# Patient Record
Sex: Female | Born: 1985 | Race: Black or African American | Hispanic: No | Marital: Married | Smoking: Former smoker
Health system: Southern US, Community
[De-identification: ages and names within clinical notes are randomized; demographics above are authoritative.]

## PROBLEM LIST (undated history)

## (undated) ENCOUNTER — Inpatient Hospital Stay (HOSPITAL_COMMUNITY): Payer: Self-pay

## (undated) DIAGNOSIS — A749 Chlamydial infection, unspecified: Secondary | ICD-10-CM

## (undated) DIAGNOSIS — A549 Gonococcal infection, unspecified: Secondary | ICD-10-CM

## (undated) HISTORY — DX: Chlamydial infection, unspecified: A74.9

## (undated) HISTORY — PX: DILATION AND EVACUATION: SHX1459

## (undated) HISTORY — DX: Gonococcal infection, unspecified: A54.9

---
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
## 2004-04-21 ENCOUNTER — Inpatient Hospital Stay (HOSPITAL_COMMUNITY): Admission: AD | Admit: 2004-04-21 | Discharge: 2004-04-21 | Payer: Self-pay | Admitting: Obstetrics and Gynecology

## 2004-04-21 ENCOUNTER — Emergency Department (HOSPITAL_COMMUNITY): Admission: EM | Admit: 2004-04-21 | Discharge: 2004-04-21 | Payer: Self-pay | Admitting: Family Medicine

## 2004-06-08 ENCOUNTER — Inpatient Hospital Stay (HOSPITAL_COMMUNITY): Admission: AD | Admit: 2004-06-08 | Discharge: 2004-06-09 | Payer: Self-pay | Admitting: Obstetrics and Gynecology

## 2004-06-20 ENCOUNTER — Inpatient Hospital Stay (HOSPITAL_COMMUNITY): Admission: AD | Admit: 2004-06-20 | Discharge: 2004-06-22 | Payer: Self-pay | Admitting: Obstetrics and Gynecology

## 2004-08-10 ENCOUNTER — Other Ambulatory Visit: Admission: RE | Admit: 2004-08-10 | Discharge: 2004-08-10 | Payer: Self-pay | Admitting: Obstetrics and Gynecology
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
## 2009-09-10 ENCOUNTER — Emergency Department (HOSPITAL_COMMUNITY): Admission: EM | Admit: 2009-09-10 | Discharge: 2009-09-10 | Payer: Self-pay | Admitting: Emergency Medicine

## 2012-12-03 NOTE — L&D Delivery Note (Signed)
Delivery Note Overnight, she continued to have irregular ctx and pelvic pressure, as well as some spotting.  She was given Indocin to help with ctx, started on Unasyn for temp of 100.5.  She was transferred to L&D and received an epidural.  This am she was feeling more pressure, nurse could not feel any cervix on exam, pt requested AROM to help complete the process.  When I checked her, there was BBOW almost to introitus, I could not palpate any cervix.  The situation was discussed with the pt and she agreed to AROM.  AROM performed, copious clear yellow fluid, vertex immediately at +2 station.  She pushed well within minutes of AROM.  At 6:18 AM a non-viable female was delivered via Vaginal, Spontaneous Delivery (Presentation: vtx). The baby was moving and still had a slow heartbeat 20 minutes after delivery, weight pending.   While trying to assist in delivering the placenta, the cord pulled off of the placenta, cord and some membranes delivered.  The placenta remained in situ, mild-mod bleeding.  Anesthesia: Epidural  Episiotomy: None Lacerations:  None Suture Repair: none Est. Blood Loss (mL): 400  Discussed retained placenta and need to go to the OR to remove it.  The procedure and risks were discussed, consent signed.    [name] D 06/04/2013, 6:51 AM

## 2013-03-03 ENCOUNTER — Encounter (HOSPITAL_COMMUNITY): Payer: Self-pay

## 2013-03-03 ENCOUNTER — Inpatient Hospital Stay (HOSPITAL_COMMUNITY)
Admission: AD | Admit: 2013-03-03 | Discharge: 2013-03-03 | Disposition: A | Discharge status: Home / Self Care | Payer: Medicaid Other | Source: Ambulatory Visit | Attending: Obstetrics & Gynecology | Admitting: Obstetrics & Gynecology

## 2013-03-03 DIAGNOSIS — Z3201 Encounter for pregnancy test, result positive: Secondary | ICD-10-CM

## 2013-03-03 LAB — POCT PREGNANCY, URINE: Preg Test, Ur: POSITIVE — AB

## 2013-03-03 NOTE — MAU Note (Signed)
Pt states LMP roughly 2nd or 3rd week in February. Has morning sickness, was having bleeding last week, however none now, denies pain at present.

## 2013-03-03 NOTE — MAU Provider Note (Signed)
[name] is a 27 y.o. G2P1001 at [redacted]w[redacted]d who presents to MAU today for confirmation of pregnancy. The patient states no vaginal bleeding, abdominal pain or fever today. She has occasional nausea without vomiting. She is able to take in PO liquids and solids today.   BP 96/66  Pulse 73  Temp(Src) 98.4 F (36.9 C) (Oral)  Resp 18  Ht 5\' 4"  (1.626 m)  Wt 156 lb 8 oz (70.988 kg)  BMI 26.85 kg/m2  LMP 01/17/2013 GENERAL: Well-developed, well-nourished female in no acute distress.  HEENT: Normocephalic, atraumatic. LUNGS: Effort normal HEART: Regular rate  SKIN: Warm, dry and without erythema PSYCH: appropriate mood and affect  Results for orders placed during the hospital encounter of 03/03/13 (from the past 24 hour(s))  POCT PREGNANCY, URINE     Status: Abnormal   Collection Time    03/03/13 10:30 AM      Result Value Range   Preg Test, Ur POSITIVE (*) NEGATIVE    A: Positive pregnancy test  P: Discharge home Pregnancy confirmation letter and list of area OB providers given Bleeding/Ectopic precautions discussed Patient may return to MAU as needed or if her condition were to change or worsen  Freddi Starr, PA-C 03/03/2013 10:35 AM

## 2013-03-03 NOTE — MAU Note (Signed)
Pt here for upt only, and verification letter. Has to be at work at 1100 am. No pain. JEthier, PA-C discharged pt from triage room.

## 2013-03-29 ENCOUNTER — Inpatient Hospital Stay (HOSPITAL_COMMUNITY): Payer: Medicaid Other

## 2013-03-29 ENCOUNTER — Encounter (HOSPITAL_COMMUNITY): Payer: Self-pay | Admitting: *Deleted

## 2013-03-29 ENCOUNTER — Inpatient Hospital Stay (HOSPITAL_COMMUNITY)
Admission: AD | Admit: 2013-03-29 | Discharge: 2013-03-29 | Disposition: A | Discharge status: Home / Self Care | Payer: Medicaid Other | Source: Ambulatory Visit | Attending: Obstetrics & Gynecology | Admitting: Obstetrics & Gynecology

## 2013-03-29 DIAGNOSIS — O99891 Other specified diseases and conditions complicating pregnancy: Secondary | ICD-10-CM | POA: Insufficient documentation

## 2013-03-29 DIAGNOSIS — R109 Unspecified abdominal pain: Secondary | ICD-10-CM | POA: Insufficient documentation

## 2013-03-29 DIAGNOSIS — N949 Unspecified condition associated with female genital organs and menstrual cycle: Secondary | ICD-10-CM

## 2013-03-29 LAB — URINALYSIS, ROUTINE W REFLEX MICROSCOPIC
Glucose, UA: NEGATIVE mg/dL
Ketones, ur: 15 mg/dL — AB
Nitrite: NEGATIVE
Specific Gravity, Urine: >1.03 — ABNORMAL HIGH (ref 1.005–1.030)
pH: 5.5 (ref 5.0–8.0)

## 2013-03-29 LAB — URINE MICROSCOPIC-ADD ON

## 2013-03-29 NOTE — MAU Note (Signed)
Pt presents with complaints of abdominal pain that started while she was in church today.Denies any bleeding

## 2013-03-29 NOTE — MAU Provider Note (Signed)
History     CSN: [id number]  Arrival date and time: 03/29/13 1146   None     Chief Complaint  Patient presents with  . Abdominal Pain   HPI 27 y.o. G2P1001 at [redacted]w[redacted]d with intermittent low abd pain starting today about 1.5 hours ago. No bleeding or discharge.    Past Medical History  Diagnosis Date  . Medical history non-contributory     Past Surgical History  Procedure Laterality Date  . No past surgeries      History reviewed. No pertinent family history.  History  Substance Use Topics  . Smoking status: Former Smoker -- 0.10 packs/day    Types: Cigarettes  . Smokeless tobacco: Former Neurosurgeon    Quit date: 03/04/2013  . Alcohol Use: Yes     Comment: not since pregnancy confirmed    Allergies: No Known Allergies  No prescriptions prior to admission    Review of Systems  Constitutional: Negative.   Respiratory: Negative.   Cardiovascular: Negative.   Gastrointestinal: Positive for abdominal pain. Negative for nausea, vomiting, diarrhea and constipation.  Genitourinary: Negative for dysuria, urgency, frequency, hematuria and flank pain.       Negative for vaginal bleeding, vaginal discharge, dyspareunia  Musculoskeletal: Negative.   Neurological: Negative.   Psychiatric/Behavioral: Negative.    Physical Exam   Blood pressure 117/62, pulse 74, temperature 98 F (36.7 C), temperature source Oral, resp. rate 18, last menstrual period 01/17/2013.  Physical Exam  Nursing note and vitals reviewed. Constitutional: She is oriented to person, place, and time. She appears well-developed and well-nourished. No distress.  Cardiovascular: Normal rate.   Respiratory: Effort normal.  GI: Soft. There is no tenderness.  Musculoskeletal: Normal range of motion.  Neurological: She is alert and oriented to person, place, and time.  Skin: Skin is warm.  Psychiatric: She has a normal mood and affect.   Unable to doppler FHR  MAU Course  Procedures Results for orders  placed during the hospital encounter of 03/29/13 (from the past 24 hour(s))  URINALYSIS, ROUTINE W REFLEX MICROSCOPIC     Status: Abnormal   Collection Time    03/29/13 12:15 PM      Result Value Range   Color, Urine YELLOW  YELLOW   APPearance CLEAR  CLEAR   Specific Gravity, Urine >1.030 (*) 1.005 - 1.030   pH 5.5  5.0 - 8.0   Glucose, UA NEGATIVE  NEGATIVE mg/dL   Hgb urine dipstick NEGATIVE  NEGATIVE   Bilirubin Urine NEGATIVE  NEGATIVE   Ketones, ur 15 (*) NEGATIVE mg/dL   Protein, ur NEGATIVE  NEGATIVE mg/dL   Urobilinogen, UA 1.0  0.0 - 1.0 mg/dL   Nitrite NEGATIVE  NEGATIVE   Leukocytes, UA TRACE (*) NEGATIVE  URINE MICROSCOPIC-ADD ON     Status: Abnormal   Collection Time    03/29/13 12:15 PM      Result Value Range   Squamous Epithelial / LPF MANY (*) RARE   WBC, UA 3-6  <3 WBC/hpf   Urine-Other MUCOUS PRESENT      US Ob Comp Less 14 Wks  03/29/2013  *RADIOLOGY REPORT*  Clinical Data: 27 year old pregnant female with pelvic pain. Estimated gestational age of [redacted] weeks 1 day by LMP.  OBSTETRIC <14 WK ULTRASOUND  Technique:  Transabdominal ultrasound was performed for evaluation of the gestation as well as the maternal uterus and adnexal regions.  Comparison:  None  Intrauterine gestational sac: Visualized/normal in shape. Yolk sac: Present Embryo: Present Cardiac Activity: Present  Heart Rate: 156 bpm  CRL:  54 mm  12 w  1 d        [place] EDC: 10/10/2013  Maternal uterus/Adnexae: There is no evidence of subchorionic hemorrhage. The ovaries bilaterally are unremarkable There is no evidence of free fluid or solid adnexal mass.  IMPRESSION: Single living intrauterine gestation with estimated gestational age of [redacted] weeks 1 day by this ultrasound.  No evidence of subchorionic hemorrhage.   Original Report Authenticated By: Harmon Pier, M.D.    Assessment and Plan  27 y.o. Z6X0960 at [redacted]w[redacted]d with viable IUP on u/s F/U for prenatal care as soon as possible, list of providers given, pregnancy  verification given, precautions rev'd  [name] 03/29/2013, 12:40 PM

## 2013-04-02 ENCOUNTER — Inpatient Hospital Stay (HOSPITAL_COMMUNITY)
Admission: AD | Admit: 2013-04-02 | Discharge: 2013-04-02 | Disposition: A | Discharge status: Home / Self Care | Payer: Medicaid Other | Source: Ambulatory Visit | Attending: Obstetrics and Gynecology | Admitting: Obstetrics and Gynecology

## 2013-04-02 ENCOUNTER — Encounter (HOSPITAL_COMMUNITY): Payer: Self-pay | Admitting: *Deleted

## 2013-04-02 DIAGNOSIS — O209 Hemorrhage in early pregnancy, unspecified: Secondary | ICD-10-CM

## 2013-04-02 LAB — ABO/RH: ABO/RH(D): A POS

## 2013-04-02 LAB — URINALYSIS, ROUTINE W REFLEX MICROSCOPIC
Bilirubin Urine: NEGATIVE
Glucose, UA: NEGATIVE mg/dL
Ketones, ur: NEGATIVE mg/dL
Leukocytes, UA: NEGATIVE
Nitrite: NEGATIVE
Specific Gravity, Urine: 1.015 (ref 1.005–1.030)
pH: 7.5 (ref 5.0–8.0)

## 2013-04-02 LAB — CBC
HCT: 36 % (ref 36.0–46.0)
Hemoglobin: 12.3 g/dL (ref 12.0–15.0)
MCH: 32.7 pg (ref 26.0–34.0)
RBC: 3.76 MIL/uL — ABNORMAL LOW (ref 3.87–5.11)

## 2013-04-02 LAB — URINE MICROSCOPIC-ADD ON

## 2013-04-02 LAB — WET PREP, GENITAL: Trich, Wet Prep: NONE SEEN

## 2013-04-02 MED ORDER — PRENATAL PLUS 27-1 MG PO TABS: 1.0000 | ORAL_TABLET | Freq: Every day | ORAL | Status: DC

## 2013-04-02 NOTE — MAU Note (Signed)
Had ligjht bleeding about a month ago.  Was here on Sunday, had US-everything was ok.  Woke up this morning noted blood passed a few small clots. None currently.

## 2013-04-02 NOTE — MAU Note (Signed)
Patient states she woke up this am with bleeding on tissue with wiping. Denies any pain.

## 2013-04-02 NOTE — MAU Provider Note (Signed)
Chief Complaint: Vaginal Bleeding  First Provider Initiated Contact with Patient 04/02/13 0800     SUBJECTIVE HPI: [name] is a 27 y.o. Z6X0960 at [redacted]w[redacted]d by LMP who presents with bleeding with wiping this morning. Denies any pain or passage of tissue.   Past Medical History  Diagnosis Date  . Medical history non-contributory    OB History   Grav Para Term Preterm Abortions TAB SAB Ect Mult Living   8 1 1  6 6    1      # Outc Date GA Lbr Len/2nd Wgt Sex Del Anes PTL Lv   1 TRM            2 TAB            3 TAB            4 TAB            5 TAB            6 TAB            7 TAB            8 CUR              Past Surgical History  Procedure Laterality Date  . No past surgeries    . Dilation and evacuation      Pt reports that she has had 6 surgical elective ABs - pt disclosed this to sonographer and stated that she did not want anyone else to know   History   Social History  . Marital Status: Single    Spouse Name: N/A    Number of Children: N/A  . Years of Education: N/A   Occupational History  . Not on file.   Social History Main Topics  . Smoking status: Former Smoker -- 0.10 packs/day    Types: Cigarettes  . Smokeless tobacco: Former Neurosurgeon    Quit date: 03/04/2013  . Alcohol Use: Yes     Comment: not since pregnancy confirmed  . Drug Use: No  . Sexually Active: Yes    Birth Control/ Protection: None   Other Topics Concern  . Not on file   Social History Narrative  . No narrative on file   No current facility-administered medications on file prior to encounter.   No current outpatient prescriptions on file prior to encounter.   No Known Allergies  ROS: Pertinent items in HPI  CBC    Component Value Date/Time   WBC 5.8 04/02/2013 0735   RBC 3.76* 04/02/2013 0735   HGB 12.3 04/02/2013 0735   HCT 36.0 04/02/2013 0735   PLT 213 04/02/2013 0735   MCV 95.7 04/02/2013 0735   MCH 32.7 04/02/2013 0735   MCHC 34.2 04/02/2013 0735   RDW 12.2 04/02/2013 0735    --/--/A POS (05/01 0735)  OBJECTIVE Blood pressure 114/68, pulse 76, temperature 98.2 F (36.8 C), temperature source Oral, resp. rate 16, height 5\' 3"  (1.6 m), weight 73.664 kg (162 lb 6.4 oz), last menstrual period 01/17/2013, SpO2 100.00%. GENERAL: Well-developed, well-nourished female in no acute distress.  HEENT: Normocephalic HEART: normal rate RESP: normal effort ABDOMEN: Soft, non-tender EXTREMITIES: Nontender, no edema NEURO: Alert and oriented SPECULUM EXAM: NEFG, scant amount of pink blood noted, cervix clean. BIMANUAL: cervix closed; uterus 12 week size, no adnexal tenderness or masses FHR 158 by doppler  LAB RESULTS   IMAGING US Ob Comp Less 14 Wks  03/29/2013  *RADIOLOGY REPORT*  Clinical Data:  27 year old pregnant female with pelvic pain. Estimated gestational age of [redacted] weeks 1 day by LMP.  OBSTETRIC <14 WK ULTRASOUND  Technique:  Transabdominal ultrasound was performed for evaluation of the gestation as well as the maternal uterus and adnexal regions.  Comparison:  None  Intrauterine gestational sac: Visualized/normal in shape. Yolk sac: Present Embryo: Present Cardiac Activity: Present Heart Rate: 156 bpm  CRL:  54 mm  12 w  1 d        [place] EDC: 10/10/2013  Maternal uterus/Adnexae: There is no evidence of subchorionic hemorrhage. The ovaries bilaterally are unremarkable There is no evidence of free fluid or solid adnexal mass.  IMPRESSION: Single living intrauterine gestation with estimated gestational age of [redacted] weeks 1 day by this ultrasound.  No evidence of subchorionic hemorrhage.   Original Report Authenticated By: Harmon Pier, M.D.     MAU COURSE Care of pt turned over to Caren Griffins, CNM at 0800.  [place], CNM 04/02/2013 8:13 AM   ASSESSMENT 1. Bleeding in early pregnancy   Viable 12 wk IUP, no [facility]   PLAN Discharge home    Medication List    TAKE these medications       prenatal vitamin w/FE, FA 27-1 MG Tabs  Take 1 tablet by mouth daily.        Follow-up Information   Follow up with [facility] [place]. Schedule an appointment as soon as possible for a visit in 1 week. (See lilst of prenatal care providers below)    Contact information:   [address] [place] [address] [phone]

## 2013-04-03 LAB — GC/CHLAMYDIA PROBE AMP: CT Probe RNA: POSITIVE — AB

## 2013-04-03 NOTE — MAU Provider Note (Signed)
Attestation of Attending Supervision of Advanced Practitioner (CNM/NP): Evaluation and management procedures were performed by the Advanced Practitioner under my supervision and collaboration.  I have reviewed the Advanced Practitioner's note and chart, and I agree with the management and plan.  [name] 04/03/2013 7:34 AM

## 2013-04-04 ENCOUNTER — Encounter: Payer: Self-pay | Admitting: Advanced Practice Midwife

## 2013-04-04 DIAGNOSIS — O98211 Gonorrhea complicating pregnancy, first trimester: Secondary | ICD-10-CM | POA: Insufficient documentation

## 2013-04-04 DIAGNOSIS — A749 Chlamydial infection, unspecified: Secondary | ICD-10-CM | POA: Insufficient documentation

## 2013-04-04 DIAGNOSIS — O98819 Other maternal infectious and parasitic diseases complicating pregnancy, unspecified trimester: Secondary | ICD-10-CM

## 2013-04-30 LAB — OB RESULTS CONSOLE HIV ANTIBODY (ROUTINE TESTING): HIV: NONREACTIVE

## 2013-04-30 LAB — OB RESULTS CONSOLE GC/CHLAMYDIA
Chlamydia: NEGATIVE
Gonorrhea: NEGATIVE

## 2013-04-30 LAB — OB RESULTS CONSOLE RUBELLA ANTIBODY, IGM: Rubella: IMMUNE

## 2013-04-30 LAB — OB RESULTS CONSOLE ANTIBODY SCREEN: Antibody Screen: NEGATIVE

## 2013-04-30 LAB — OB RESULTS CONSOLE RPR: RPR: NONREACTIVE

## 2013-04-30 LAB — OB RESULTS CONSOLE HEPATITIS B SURFACE ANTIGEN: Hepatitis B Surface Ag: NEGATIVE

## 2013-06-03 ENCOUNTER — Inpatient Hospital Stay (HOSPITAL_COMMUNITY)
Admission: AD | Admit: 2013-06-03 | Discharge: 2013-06-05 | DRG: 767 | Disposition: A | Discharge status: Home / Self Care | Payer: Medicaid Other | Source: Ambulatory Visit | Attending: Obstetrics and Gynecology | Admitting: Obstetrics and Gynecology

## 2013-06-03 ENCOUNTER — Encounter (HOSPITAL_COMMUNITY): Payer: Self-pay | Admitting: *Deleted

## 2013-06-03 ENCOUNTER — Inpatient Hospital Stay (HOSPITAL_COMMUNITY): Payer: Medicaid Other

## 2013-06-03 DIAGNOSIS — O4700 False labor before 37 completed weeks of gestation, unspecified trimester: Secondary | ICD-10-CM | POA: Diagnosis present

## 2013-06-03 DIAGNOSIS — A749 Chlamydial infection, unspecified: Secondary | ICD-10-CM

## 2013-06-03 DIAGNOSIS — O321XX0 Maternal care for breech presentation, not applicable or unspecified: Secondary | ICD-10-CM | POA: Diagnosis present

## 2013-06-03 DIAGNOSIS — O4290 Premature rupture of membranes, unspecified as to length of time between rupture and onset of labor, unspecified weeks of gestation: Principal | ICD-10-CM | POA: Diagnosis present

## 2013-06-03 DIAGNOSIS — O4702 False labor before 37 completed weeks of gestation, second trimester: Secondary | ICD-10-CM

## 2013-06-03 DIAGNOSIS — O98211 Gonorrhea complicating pregnancy, first trimester: Secondary | ICD-10-CM

## 2013-06-03 LAB — CBC
HCT: 31.8 % — ABNORMAL LOW (ref 36.0–46.0)
MCH: 32.2 pg (ref 26.0–34.0)
MCHC: 34.3 g/dL (ref 30.0–36.0)
RDW: 12.6 % (ref 11.5–15.5)

## 2013-06-03 MED ORDER — SODIUM CHLORIDE 0.9 % IV SOLN
2.0000 g | Freq: Four times a day (QID) | INTRAVENOUS | Status: DC
  Administered 2013-06-03: 2 g via INTRAVENOUS
  Filled 2013-06-03: qty 2000

## 2013-06-03 MED ORDER — NIFEDIPINE 10 MG PO CAPS
20.0000 mg | ORAL_CAPSULE | ORAL | Status: DC | PRN
  Administered 2013-06-03 (×2): 20 mg via ORAL
  Filled 2013-06-03 (×3): qty 2

## 2013-06-03 MED ORDER — INDOMETHACIN 50 MG RE SUPP
50.0000 mg | Freq: Three times a day (TID) | RECTAL | Status: DC
  Administered 2013-06-03: 50 mg via RECTAL
  Filled 2013-06-03 (×3): qty 1

## 2013-06-03 MED ORDER — IBUPROFEN 600 MG PO TABS: 600.0000 mg | ORAL_TABLET | Freq: Four times a day (QID) | ORAL | Status: DC | PRN

## 2013-06-03 MED ORDER — OXYCODONE-ACETAMINOPHEN 5-325 MG PO TABS: 1.0000 | ORAL_TABLET | ORAL | Status: DC | PRN

## 2013-06-03 MED ORDER — ACETAMINOPHEN 325 MG PO TABS
650.0000 mg | ORAL_TABLET | ORAL | Status: DC | PRN
  Administered 2013-06-03: 650 mg via ORAL
  Filled 2013-06-03: qty 2

## 2013-06-03 MED ORDER — DOCUSATE SODIUM 100 MG PO CAPS: 100.0000 mg | ORAL_CAPSULE | Freq: Every day | ORAL | Status: DC

## 2013-06-03 MED ORDER — PRENATAL MULTIVITAMIN CH: 1.0000 | ORAL_TABLET | Freq: Every day | ORAL | Status: DC

## 2013-06-03 MED ORDER — OXYTOCIN 40 UNITS IN LACTATED RINGERS INFUSION - SIMPLE MED
62.5000 mL/h | INTRAVENOUS | Status: DC
  Filled 2013-06-03: qty 1000

## 2013-06-03 MED ORDER — LACTATED RINGERS IV SOLN
INTRAVENOUS | Status: DC
  Administered 2013-06-03 – 2013-06-04 (×4): via INTRAVENOUS

## 2013-06-03 MED ORDER — CALCIUM CARBONATE ANTACID 500 MG PO CHEW: 2.0000 | CHEWABLE_TABLET | ORAL | Status: DC | PRN

## 2013-06-03 MED ORDER — AMOXICILLIN 500 MG PO CAPS: 500.0000 mg | ORAL_CAPSULE | Freq: Three times a day (TID) | ORAL | Status: DC

## 2013-06-03 MED ORDER — ONDANSETRON HCL 4 MG/2ML IJ SOLN: 4.0000 mg | Freq: Four times a day (QID) | INTRAMUSCULAR | Status: DC | PRN

## 2013-06-03 MED ORDER — AZITHROMYCIN 500 MG PO TABS
500.0000 mg | ORAL_TABLET | Freq: Every day | ORAL | Status: DC
  Administered 2013-06-03: 500 mg via ORAL
  Filled 2013-06-03: qty 1

## 2013-06-03 MED ORDER — ZOLPIDEM TARTRATE 5 MG PO TABS: 5.0000 mg | ORAL_TABLET | Freq: Every evening | ORAL | Status: DC | PRN

## 2013-06-03 MED ORDER — CITRIC ACID-SODIUM CITRATE 334-500 MG/5ML PO SOLN
30.0000 mL | ORAL | Status: DC | PRN
  Administered 2013-06-04: 30 mL via ORAL
  Filled 2013-06-03: qty 15

## 2013-06-03 MED ORDER — BUTORPHANOL TARTRATE 1 MG/ML IJ SOLN
1.0000 mg | INTRAMUSCULAR | Status: DC | PRN
  Administered 2013-06-03 – 2013-06-04 (×3): 1 mg via INTRAVENOUS
  Filled 2013-06-03 (×3): qty 1

## 2013-06-03 MED ORDER — FLEET ENEMA 7-19 GM/118ML RE ENEM: 1.0000 | ENEMA | Freq: Every day | RECTAL | Status: DC | PRN

## 2013-06-03 MED ORDER — LIDOCAINE HCL (PF) 1 % IJ SOLN
30.0000 mL | INTRAMUSCULAR | Status: DC | PRN
  Filled 2013-06-03: qty 30

## 2013-06-03 MED ORDER — OXYTOCIN BOLUS FROM INFUSION: 500.0000 mL | INTRAVENOUS | Status: DC

## 2013-06-03 MED ORDER — LACTATED RINGERS IV SOLN
500.0000 mL | INTRAVENOUS | Status: DC | PRN
  Administered 2013-06-04: 300 mL via INTRAVENOUS

## 2013-06-03 MED ORDER — AMPICILLIN-SULBACTAM SODIUM 3 (2-1) G IJ SOLR
3.0000 g | Freq: Four times a day (QID) | INTRAMUSCULAR | Status: DC
  Administered 2013-06-03 – 2013-06-04 (×2): 3 g via INTRAVENOUS
  Filled 2013-06-03 (×6): qty 3

## 2013-06-03 MED ORDER — INDOMETHACIN 25 MG SUPPOSITORY: 25.0000 mg | Freq: Three times a day (TID) | RECTAL | Status: DC

## 2013-06-03 NOTE — Progress Notes (Signed)
Encouraged side-lying, voiding , and increased po fluids

## 2013-06-03 NOTE — H&P (Signed)
[name] is a 27 y.o. female, G8 P28, EGA 21W 4D, presenting for possible PPROM.  Pt seen i the office today with c/o leaking fluid followed by some cramping.  In the office, fundus appropriate, FHT 150.  On spec exam, BBOW, + nitrazine, neg fern.  Situation discussed with pt and she was admitted to antenatal.  Maternal Medical History:  Reason for admission: Contractions.   Contractions: Frequency: irregular.   Perceived severity is moderate.    Fetal activity: Perceived fetal activity is normal.      OB History   Grav Para Term Preterm Abortions TAB SAB Ect Mult Living   8 1 1  6 6    1     SVD at term Past Medical History  Diagnosis Date  . Chlamydia   . Gonorrhea    Past Surgical History  Procedure Laterality Date  . Dilation and evacuation      Pt reports that she has had 6 surgical elective ABs - pt disclosed this to sonographer and stated that she did not want anyone else to know   Family History: family history includes Cancer in her father and maternal grandfather and Heart disease in her father. Social History:  reports that she has quit smoking. Her smoking use included Cigarettes. She smoked 0.10 packs per day. She quit smokeless tobacco use about 2 months ago. She reports that  drinks alcohol. She reports that she does not use illicit drugs.   Review of Systems  Respiratory: Negative.   Cardiovascular: Negative.     Dilation: 5.5 Exam by:: Dr Mesiger (BBOW) Blood pressure 107/53, pulse 85, temperature 98.6 F (37 C), temperature source Oral, resp. rate 18, height 5\' 3"  (1.6 m), weight 79.379 kg (175 lb), last menstrual period 01/17/2013. Maternal Exam:  Uterine Assessment: Contraction strength is mild.  Contraction frequency is irregular.   Abdomen: Patient reports no abdominal tenderness. Introitus: Normal vulva. Normal vagina.  Ferning test: negative.  Nitrazine test: positive.  Pelvis: adequate for delivery.   Cervix: Cervix evaluated by  sterile speculum exam and digital exam.     Fetal Exam Fetal Monitor Review: Mode: ultrasound.   Baseline rate: 150.      Physical Exam  Constitutional: She appears well-developed and well-nourished.  Cardiovascular: Normal rate, regular rhythm and normal heart sounds.   No murmur heard. Respiratory: Effort normal and breath sounds normal. No respiratory distress. She has no wheezes.  GI: Soft.  Gravid, fundus at umbilicus    Prenatal labs: ABO, Rh: --/--/A POS (05/01 2130) Antibody: Negative (05/29 0000) Rubella: Immune (05/29 0000) RPR: Nonreactive (05/29 0000)  HBsAg: Negative (05/29 0000)  HIV: Non-reactive (05/29 0000)  GBS:     Assessment/Plan: IUP at 21W 4D with BBOW, irreg ctx, cervix 5-6 cm by exam.  Amnisure neg, nl AFV on u/s so unable to confirm ROM, but definitely with preterm dilation.  Breech by u/s, confirms EGA.  Discussed situation with pt, baby is not currently viable.  Discussed high risk of ROM with delivery.  Will admit, bedrest with BRP, Procardia prn ctx, monitor for ROM or further dilation.  If makes it to 23 weeks would get NICU consult and give steroids.   [name] 06/03/2013, 5:19 PM

## 2013-06-04 ENCOUNTER — Encounter (HOSPITAL_COMMUNITY)
Admission: AD | Disposition: A | Discharge status: Home / Self Care | Payer: Self-pay | Source: Ambulatory Visit | Attending: Obstetrics and Gynecology

## 2013-06-04 ENCOUNTER — Encounter (HOSPITAL_COMMUNITY): Payer: Self-pay | Admitting: Anesthesiology

## 2013-06-04 ENCOUNTER — Inpatient Hospital Stay (HOSPITAL_COMMUNITY): Payer: Medicaid Other | Admitting: Anesthesiology

## 2013-06-04 HISTORY — PX: DILATION AND EVACUATION: SHX1459

## 2013-06-04 LAB — CBC
Hemoglobin: 10.5 g/dL — ABNORMAL LOW (ref 12.0–15.0)
MCH: 31.8 pg (ref 26.0–34.0)
MCHC: 33.9 g/dL (ref 30.0–36.0)
MCV: 93.9 fL (ref 78.0–100.0)
RBC: 3.3 MIL/uL — ABNORMAL LOW (ref 3.87–5.11)

## 2013-06-04 SURGERY — DILATION AND EVACUATION, UTERUS
Anesthesia: Epidural

## 2013-06-04 MED ORDER — OXYCODONE-ACETAMINOPHEN 5-325 MG PO TABS
1.0000 | ORAL_TABLET | ORAL | Status: DC | PRN
  Administered 2013-06-04 (×2): 2 via ORAL
  Administered 2013-06-04: 1 via ORAL
  Administered 2013-06-05 (×2): 2 via ORAL
  Filled 2013-06-04 (×2): qty 2
  Filled 2013-06-04: qty 1
  Filled 2013-06-04 (×2): qty 2

## 2013-06-04 MED ORDER — EPHEDRINE 5 MG/ML INJ: 10.0000 mg | INTRAVENOUS | Status: DC | PRN

## 2013-06-04 MED ORDER — MEASLES, MUMPS & RUBELLA VAC ~~LOC~~ INJ
0.5000 mL | INJECTION | Freq: Once | SUBCUTANEOUS | Status: DC
  Filled 2013-06-04: qty 0.5

## 2013-06-04 MED ORDER — SENNOSIDES-DOCUSATE SODIUM 8.6-50 MG PO TABS
2.0000 | ORAL_TABLET | Freq: Every day | ORAL | Status: DC
  Administered 2013-06-04: 2 via ORAL

## 2013-06-04 MED ORDER — DIBUCAINE 1 % RE OINT: 1.0000 "application " | TOPICAL_OINTMENT | RECTAL | Status: DC | PRN

## 2013-06-04 MED ORDER — PHENYLEPHRINE HCL 10 MG/ML IJ SOLN
INTRAMUSCULAR | Status: DC | PRN
  Administered 2013-06-04 (×3): 40 ug via INTRAVENOUS
  Administered 2013-06-04: 80 ug via INTRAVENOUS
  Administered 2013-06-04 (×2): 40 ug via INTRAVENOUS

## 2013-06-04 MED ORDER — BENZOCAINE-MENTHOL 20-0.5 % EX AERO: 1.0000 "application " | INHALATION_SPRAY | CUTANEOUS | Status: DC | PRN

## 2013-06-04 MED ORDER — MEPERIDINE HCL 25 MG/ML IJ SOLN
INTRAMUSCULAR | Status: AC
  Administered 2013-06-04: 12.5 mg via INTRAVENOUS
  Filled 2013-06-04: qty 1

## 2013-06-04 MED ORDER — TETANUS-DIPHTH-ACELL PERTUSSIS 5-2.5-18.5 LF-MCG/0.5 IM SUSP: 0.5000 mL | Freq: Once | INTRAMUSCULAR | Status: DC

## 2013-06-04 MED ORDER — FENTANYL 2.5 MCG/ML BUPIVACAINE 1/10 % EPIDURAL INFUSION (WH - ANES)
INTRAMUSCULAR | Status: DC | PRN
  Administered 2013-06-04: 14 mL/h via EPIDURAL

## 2013-06-04 MED ORDER — DIPHENHYDRAMINE HCL 50 MG/ML IJ SOLN
12.5000 mg | INTRAMUSCULAR | Status: DC | PRN
  Administered 2013-06-04: 12.5 mg via INTRAVENOUS
  Filled 2013-06-04: qty 1

## 2013-06-04 MED ORDER — OXYTOCIN 10 UNIT/ML IJ SOLN
INTRAMUSCULAR | Status: AC
  Filled 2013-06-04: qty 4

## 2013-06-04 MED ORDER — ONDANSETRON HCL 4 MG PO TABS: 4.0000 mg | ORAL_TABLET | ORAL | Status: DC | PRN

## 2013-06-04 MED ORDER — ZOLPIDEM TARTRATE 5 MG PO TABS: 5.0000 mg | ORAL_TABLET | Freq: Every evening | ORAL | Status: DC | PRN

## 2013-06-04 MED ORDER — MEPERIDINE HCL 25 MG/ML IJ SOLN: 6.2500 mg | INTRAMUSCULAR | Status: DC | PRN

## 2013-06-04 MED ORDER — PHENYLEPHRINE 40 MCG/ML (10ML) SYRINGE FOR IV PUSH (FOR BLOOD PRESSURE SUPPORT)
PREFILLED_SYRINGE | INTRAVENOUS | Status: AC
  Filled 2013-06-04: qty 5

## 2013-06-04 MED ORDER — PRENATAL MULTIVITAMIN CH: 1.0000 | ORAL_TABLET | Freq: Every day | ORAL | Status: DC

## 2013-06-04 MED ORDER — SODIUM BICARBONATE 8.4 % IV SOLN
INTRAVENOUS | Status: DC | PRN
  Administered 2013-06-04: 5 mL via EPIDURAL

## 2013-06-04 MED ORDER — EPHEDRINE 5 MG/ML INJ
10.0000 mg | INTRAVENOUS | Status: DC | PRN
  Filled 2013-06-04: qty 4

## 2013-06-04 MED ORDER — METHYLERGONOVINE MALEATE 0.2 MG/ML IJ SOLN: 0.2000 mg | INTRAMUSCULAR | Status: DC | PRN

## 2013-06-04 MED ORDER — PHENYLEPHRINE 40 MCG/ML (10ML) SYRINGE FOR IV PUSH (FOR BLOOD PRESSURE SUPPORT): 80.0000 ug | PREFILLED_SYRINGE | INTRAVENOUS | Status: DC | PRN

## 2013-06-04 MED ORDER — LIDOCAINE HCL 2 % IJ SOLN
INTRAMUSCULAR | Status: AC
  Filled 2013-06-04: qty 20

## 2013-06-04 MED ORDER — SIMETHICONE 80 MG PO CHEW: 80.0000 mg | CHEWABLE_TABLET | ORAL | Status: DC | PRN

## 2013-06-04 MED ORDER — MAGNESIUM HYDROXIDE 400 MG/5ML PO SUSP: 30.0000 mL | ORAL | Status: DC | PRN

## 2013-06-04 MED ORDER — WITCH HAZEL-GLYCERIN EX PADS: 1.0000 "application " | MEDICATED_PAD | CUTANEOUS | Status: DC | PRN

## 2013-06-04 MED ORDER — LIDOCAINE HCL (PF) 1 % IJ SOLN
INTRAMUSCULAR | Status: DC | PRN
  Administered 2013-06-04 (×2): 9 mL

## 2013-06-04 MED ORDER — MIDAZOLAM HCL 2 MG/2ML IJ SOLN
INTRAMUSCULAR | Status: AC
  Filled 2013-06-04: qty 2

## 2013-06-04 MED ORDER — FENTANYL CITRATE 0.05 MG/ML IJ SOLN: 25.0000 ug | INTRAMUSCULAR | Status: DC | PRN

## 2013-06-04 MED ORDER — ONDANSETRON HCL 4 MG/2ML IJ SOLN: 4.0000 mg | INTRAMUSCULAR | Status: DC | PRN

## 2013-06-04 MED ORDER — MIDAZOLAM HCL 5 MG/5ML IJ SOLN
INTRAMUSCULAR | Status: DC | PRN
  Administered 2013-06-04: 2 mg via INTRAVENOUS

## 2013-06-04 MED ORDER — LACTATED RINGERS IV SOLN
500.0000 mL | Freq: Once | INTRAVENOUS | Status: AC
  Administered 2013-06-04: 500 mL via INTRAVENOUS

## 2013-06-04 MED ORDER — FENTANYL 2.5 MCG/ML BUPIVACAINE 1/10 % EPIDURAL INFUSION (WH - ANES)
14.0000 mL/h | INTRAMUSCULAR | Status: DC | PRN
  Filled 2013-06-04: qty 125

## 2013-06-04 MED ORDER — PHENYLEPHRINE 40 MCG/ML (10ML) SYRINGE FOR IV PUSH (FOR BLOOD PRESSURE SUPPORT)
80.0000 ug | PREFILLED_SYRINGE | INTRAVENOUS | Status: DC | PRN
  Filled 2013-06-04: qty 5

## 2013-06-04 MED ORDER — DIPHENHYDRAMINE HCL 25 MG PO CAPS
25.0000 mg | ORAL_CAPSULE | Freq: Four times a day (QID) | ORAL | Status: DC | PRN
  Administered 2013-06-04 (×2): 25 mg via ORAL
  Filled 2013-06-04 (×2): qty 1

## 2013-06-04 MED ORDER — IBUPROFEN 600 MG PO TABS
600.0000 mg | ORAL_TABLET | Freq: Four times a day (QID) | ORAL | Status: DC
  Administered 2013-06-04 – 2013-06-05 (×4): 600 mg via ORAL
  Filled 2013-06-04 (×4): qty 1

## 2013-06-04 MED ORDER — LANOLIN HYDROUS EX OINT: TOPICAL_OINTMENT | CUTANEOUS | Status: DC | PRN

## 2013-06-04 MED ORDER — ONDANSETRON HCL 4 MG/2ML IJ SOLN: 4.0000 mg | Freq: Once | INTRAMUSCULAR | Status: DC | PRN

## 2013-06-04 MED ORDER — OXYTOCIN 10 UNIT/ML IJ SOLN
40.0000 [IU] | INTRAVENOUS | Status: DC | PRN
  Administered 2013-06-04: 40 [IU] via INTRAVENOUS

## 2013-06-04 MED ORDER — ACETAMINOPHEN 10 MG/ML IV SOLN: 1000.0000 mg | Freq: Once | INTRAVENOUS | Status: DC | PRN

## 2013-06-04 MED ORDER — METHYLERGONOVINE MALEATE 0.2 MG PO TABS: 0.2000 mg | ORAL_TABLET | ORAL | Status: DC | PRN

## 2013-06-04 SURGICAL SUPPLY — 20 items
CATH ROBINSON RED A/P 16FR (CATHETERS) ×2 IMPLANT
CLOTH BEACON ORANGE TIMEOUT ST (SAFETY) ×2 IMPLANT
DECANTER SPIKE VIAL GLASS SM (MISCELLANEOUS) ×2 IMPLANT
GLOVE BIO SURGEON STRL SZ8 (GLOVE) ×2 IMPLANT
GLOVE ORTHO TXT STRL SZ7.5 (GLOVE) ×2 IMPLANT
GOWN STRL REIN XL XLG (GOWN DISPOSABLE) ×4 IMPLANT
KIT BERKELEY 1ST TRIMESTER 3/8 (MISCELLANEOUS) ×2 IMPLANT
NDL SPNL 22GX3.5 QUINCKE BK (NEEDLE) ×1 IMPLANT
NEEDLE SPNL 22GX3.5 QUINCKE BK (NEEDLE) ×2 IMPLANT
NS IRRIG 1000ML POUR BTL (IV SOLUTION) ×2 IMPLANT
PACK VAGINAL MINOR WOMEN LF (CUSTOM PROCEDURE TRAY) ×2 IMPLANT
PAD OB MATERNITY 4.3X12.25 (PERSONAL CARE ITEMS) ×2 IMPLANT
PAD PREP 24X48 CUFFED NSTRL (MISCELLANEOUS) ×2 IMPLANT
SET BERKELEY SUCTION TUBING (SUCTIONS) ×2 IMPLANT
SYR CONTROL 10ML LL (SYRINGE) ×2 IMPLANT
TOWEL OR 17X24 6PK STRL BLUE (TOWEL DISPOSABLE) ×4 IMPLANT
VACURETTE 10 RIGID CVD (CANNULA) IMPLANT
VACURETTE 7MM CVD STRL WRAP (CANNULA) IMPLANT
VACURETTE 8 RIGID CVD (CANNULA) IMPLANT
VACURETTE 9 RIGID CVD (CANNULA) IMPLANT

## 2013-06-04 NOTE — Anesthesia Preprocedure Evaluation (Signed)

## 2013-06-04 NOTE — Progress Notes (Signed)
06/04/13 1600  Clinical Encounter Type  Visited With Patient and family together (Demitri)  Visit Type Initial;Spiritual support;Social support;Death (loss of baby)  Referral From Nurse  Spiritual Encounters  Spiritual Needs Grief support;Emotional;Prayer;Literature;Sacred text   Made very long visit with Morrie Sheldon and Demitri, providing spiritual and emotional support, grief education, Bible and inspirational literature and prayer per request.  Encouraged self-care, normalized tears and waves of emotions (especially because Priti apologized for crying and also said repeatedly that she "didn't know it [the baby's death] would hurt so bad."  Per Demitri, "she's a Clinical research associate," so I made Danetta a simple multicolored journal; upon receiving it, she proclaimed, "This is gonna be my new best friend!" Also reviewed Comfort and Heartstrings resources in packet.  Family very receptive to spiritual and emotional support.  Will refer to Kathleen Argue for follow-up care.  MD:  Torrie has specifically asked how long she should stay out of work for physical and emotional healing.  Please help her think through her healing process as you advise.  Thank you!    [address] [place], [place] [phone]

## 2013-06-04 NOTE — Op Note (Signed)
  Preoperative Diagnosis:  S/p SVD at 21 weeks, retained placenta Postop Diagnosis:  Same Procedure:  D&C, manual removal of placenta Anesthesia:  Epidural Findings: Uterus was firm, placenta and membranes removed in fragments Specimens:  Placental tissue sent for routine pathology Estimated blood loss: 100 Complications: None  Procedure in detail: The patient was taken to the operating room and placed in the dorsosupine position. Her previously placed epidural was dosed appropriately. Perineum and vagina were prepped and draped in the usual sterile fashion, bladder drained with a red Robinson catheter. A Graves speculum was inserted in the vagina. The anterior lip of the cervix was grasped with a ring forceps.  I was able to reach into the uterus and manually deliver most of the placenta.  Suction curettage with a 16 curved curette was difficult and removed minimal tissue.  Sharp curettage revealed fair uterine cry and minimal tissue.  Manual exploration removed a few small fragments of tissue, no significant tissue palpated.  Sharp curettage revealed good uterine cry, no further tissue.  The uterus was firming, no heavy bleeding.  All instruments were removed.  Counts were correct, she was taken to PACU in stable condition.

## 2013-06-04 NOTE — Transfer of Care (Signed)
Immediate Anesthesia Transfer of Care Note  Patient: [name]  Procedure(s) Performed: Procedure(s): DILATATION AND EVACUATION (N/A)  Patient Location: PACU  Anesthesia Type:Epidural  Level of Consciousness: awake and patient cooperative  Airway & Oxygen Therapy: Patient Spontanous Breathing  Post-op Assessment: Report given to PACU RN and Post -op Vital signs reviewed and stable  Post vital signs: Reviewed and stable  Complications: No apparent anesthesia complications

## 2013-06-04 NOTE — Anesthesia Procedure Notes (Signed)
Epidural Patient location during procedure: OB Start time: 06/04/2013 1:32 AM End time: 06/04/2013 1:36 AM  Staffing Anesthesiologist: Sandrea Hughs Performed by: anesthesiologist   Preanesthetic Checklist Completed: patient identified, surgical consent, pre-op evaluation, timeout performed, IV checked, risks and benefits discussed and monitors and equipment checked  Epidural Patient position: sitting Prep: site prepped and draped and DuraPrep Patient monitoring: continuous pulse ox and blood pressure Approach: midline Injection technique: LOR air  Needle:  Needle type: Tuohy  Needle gauge: 17 G Needle length: 9 cm and 9 Needle insertion depth: 5 cm cm Catheter type: closed end flexible Catheter size: 19 Gauge Catheter at skin depth: 10 cm Test dose: negative and Other  Assessment Sensory level: T9 Events: blood not aspirated, injection not painful, no injection resistance, negative IV test and no paresthesia  Additional Notes Reason for block:procedure for pain

## 2013-06-04 NOTE — Progress Notes (Signed)
Patient leaking small amount of pink tinged fluid.  Fern positive.

## 2013-06-04 NOTE — Anesthesia Preprocedure Evaluation (Addendum)
Anesthesia Evaluation  Patient identified by MRN, date of birth, ID band Patient awake    Reviewed: Allergy & Precautions, H&P , NPO status , Patient's Chart, lab work & pertinent test results  Airway Mallampati: II TM Distance: >3 FB Neck ROM: full    Dental no notable dental hx.    Pulmonary neg pulmonary ROS,    Pulmonary exam normal       Cardiovascular negative cardio ROS      Neuro/Psych negative neurological ROS  negative psych ROS   GI/Hepatic negative GI ROS, Neg liver ROS,   Endo/Other  negative endocrine ROS  Renal/GU negative Renal ROS  negative genitourinary   Musculoskeletal negative musculoskeletal ROS (+)   Abdominal Normal abdominal exam  (+)   Peds negative pediatric ROS (+)  Hematology negative hematology ROS (+)   Anesthesia Other Findings   Reproductive/Obstetrics (+) Pregnancy                           Anesthesia Physical  Anesthesia Plan  ASA: II  Anesthesia Plan: Epidural   Post-op Pain Management:    Induction:   Airway Management Planned:   Additional Equipment:   Intra-op Plan:   Post-operative Plan:   Informed Consent: I have reviewed the patients History and Physical, chart, labs and discussed the procedure including the risks, benefits and alternatives for the proposed anesthesia with the patient or authorized representative who has indicated his/her understanding and acceptance.     Plan Discussed with: Surgeon and CRNA  Anesthesia Plan Comments: [facility] with epidural In-Situ)       Anesthesia Quick Evaluation

## 2013-06-04 NOTE — Anesthesia Postprocedure Evaluation (Signed)
  Anesthesia Post-op Note  Patient: [name]  Procedure(s) Performed: Procedure(s): DILATATION AND EVACUATION (N/A)  Patient Location: [facility]  Anesthesia Type:Epidural  Level of Consciousness: awake  Airway and Oxygen Therapy: Patient Spontanous Breathing  Post-op Pain: mild  Post-op Assessment: Patient's Cardiovascular Status Stable and Respiratory Function Stable  Post-op Vital Signs: stable  Complications: No apparent anesthesia complications

## 2013-06-05 ENCOUNTER — Encounter (HOSPITAL_COMMUNITY): Payer: Self-pay | Admitting: Obstetrics and Gynecology

## 2013-06-05 LAB — CBC
Hemoglobin: 7.9 g/dL — ABNORMAL LOW (ref 12.0–15.0)
MCHC: 33.9 g/dL (ref 30.0–36.0)
RDW: 12.6 % (ref 11.5–15.5)
WBC: 6.8 10*3/uL (ref 4.0–10.5)

## 2013-06-05 MED ORDER — IBUPROFEN 600 MG PO TABS: 600.0000 mg | ORAL_TABLET | Freq: Four times a day (QID) | ORAL | Status: DC | PRN

## 2013-06-05 MED ORDER — FERROUS SULFATE 325 (65 FE) MG PO TABS: 325.0000 mg | ORAL_TABLET | Freq: Every day | ORAL | Status: DC

## 2013-06-05 MED ORDER — OXYCODONE-ACETAMINOPHEN 5-325 MG PO TABS: 1.0000 | ORAL_TABLET | Freq: Four times a day (QID) | ORAL | Status: DC | PRN

## 2013-06-05 NOTE — Progress Notes (Signed)
Patient ID: [name], female   DOB: 05/20/1986, 27 y.o.   MRN: [id number] #1 afebrile BP normal No heavy bleeding For D/C

## 2013-06-05 NOTE — Progress Notes (Signed)
Pt discharged before CSW could provide counseling resources, as requested by MD today.  CSW mailed pt a list of therapist in the area who accepts pt's insurance.

## 2013-06-05 NOTE — Progress Notes (Signed)
I was present with the family as the grandfather held The dying baby.  The grandma and two adult relatives along with the 28 year old brother and his cousin were also present.The grandfather,sang, prayed,and talked with the baby.  He also sprinkled The baby with holy water.  I then went to room 320 To visit with the mom,and promise my continued Prayers.

## 2013-06-05 NOTE — Progress Notes (Signed)
Pt discharged to home with significant other and father.  Condition stable.  Community resources for counseling, Feelings after Birth group, and Heartstrings reviewed with pt and significant other.  Pt expressed feelings of grief and depression, but denied suicidal ideations.  Pt ambulated to car with L. Isidore Moos, NT.  No equipment for home ordered at discharge.

## 2013-06-05 NOTE — Discharge Summary (Signed)
[name], [name] NO.:  [id number]  MEDICAL RECORD NO.:  [id number]  LOCATION:  9320                          FACILITY:  [facility]  PHYSICIAN:  Malachi Pro. Ambrose Mantle, M.D. DATE OF BIRTH:  [birth year]  DATE OF ADMISSION:  06/03/2013 DATE OF DISCHARGE:  06/05/2013                              DISCHARGE SUMMARY   This is a 27 year old black female, para 1-0-6-1, gravida 8 at 21 weeks and 4 days, presented for possible preterm premature rupture of the membranes.  Patient has been seen in the office on the day of admission complaining of leaking fluid followed by some cramping.  In the office, fundus was appropriate for dates.  Fetal heart tones of 150.  On speculum exam, a bulging bag of waters was present and was positive Nitrazine, negative ferning.  Situation discussed with patient.  She was admitted to Antenatal.  Blood group and type A positive, negative antibody, rubella immune, RPR nonreactive, hepatitis B surface antigen negative, HIV negative.  After admission to the hospital, patient continued to have irregular contractions and pelvic pressure as well as some spotting.  She was given Indocin to help with contractions, started on Unasyn for temperature elevation of 100.5.  She was transferred to Labor and Delivery and received an epidural.  On the morning of June 04, 2013; she was feeling more pressure.  The nurse could not feel any cervix on exam.  Patient requested artificial rupture of the membranes to help complete the process.  When Dr. Jackelyn Knife checked her there was a bulging bag of waters almost to the introitus.  He could not palpate any cervix.  The situation was discussed with patient and she agreed, artificial rupture of the membranes that was performed with a copious amount of clear, yellow fluid; vertex immediate of +2 station.  She pushed well within minutes of artificial rupture of the membranes and at 6:18 a.m., a nonviable female was delivered  vaginally spontaneously, presentation vertex.  The baby was moving and still had a slow heartbeat, 20 minutes after delivery the weight was pending.  While trying to assist in delivering the placenta, the cord pulled off the placenta cord and some membranes were delivered.  Placenta remained in situ.  There was mild-to-moderate bleeding.  Dr. Jackelyn Knife took patient to the operating room.  The epidural was dosed appropriately.  Anterior lip of the cervix was grasped and Dr. Jackelyn Knife was able to reach into the uterus and manually delivered the most of the placenta.  Suction curettage with a 16 curved curette, was difficulty, then removed minimal tissue.  The sharp curettage revealed a fair uterine cry and minimal tissue.  Manual exploration removed a few small fragments of tissue, but no significant tissue was palpated.  Sharp curettage then revealed a good uterine cry and there was no further tissue.  The uterus was firming.  No heavy bleeding.  Procedure was terminated.  Subsequently, patient did quite well and is ready for discharge on the first postpartum and postoperative day.  Her initial hemoglobin was 10.9; hematocrit 31.8; white count 175,000; platelet count 8600.  Subsequent CBC at 12:45 a.m. on July 03 showed a hemoglobin of 10.5 and a postoperative hemoglobin  the morning of discharge was 7.9; hematocrit 23.3; white count 6800; platelet count of 134,000.  FINAL DIAGNOSES:  Intrauterine pregnancy, 21 weeks and 4 days with premature delivery and possible preterm, premature rupture of the membranes.  Retained placenta.  OPERATION:  Spontaneous delivery, vertex of the 21-week 4-day fetus and D and C.  FINAL CONDITION:  Improved.  INSTRUCTIONS:  Our regular after visit summary.  Prescriptions for ferrous sulfate 325 mg twice daily, Percocet 5/325 thirty tablets 1 every 6 hours as needed for pain, and Motrin 600 mg 30 tablets one every 6 hours as needed for pain.  She is advised  to return to the office in 1 week for followup examination.     Malachi Pro. Ambrose Mantle, M.D.     TFH/MEDQ  D:  06/05/2013  T:  06/05/2013  Job:  [id number]

## 2013-09-02 IMAGING — US US OB COMP LESS 14 WK
1 series · 14 of 20 positions shown · non-contrast
Comparison: None

CLINICAL DATA: 27-year-old pregnant female with pelvic pain.
Estimated gestational age of 10 weeks 1 day by LMP.

OBSTETRIC <14 WK ULTRASOUND
TECHNIQUE: Transabdominal ultrasound was performed for evaluation
of the gestation as well as the maternal uterus and adnexal
regions.

[Series 1: us ob comp less 14 wks · 14 of 20 slices shown]
[im 1/20]
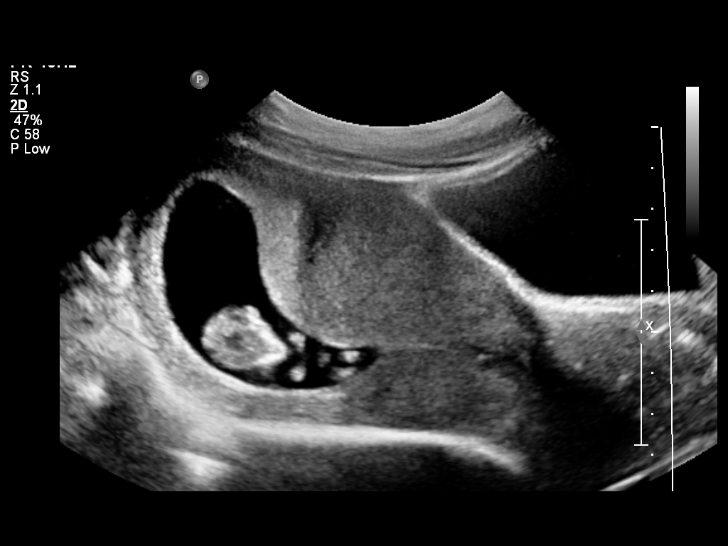
[im 3/20]
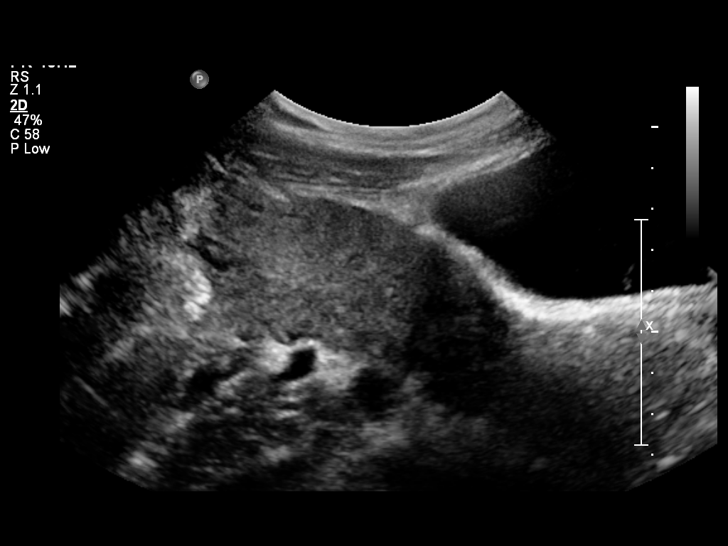
[im 4/20]
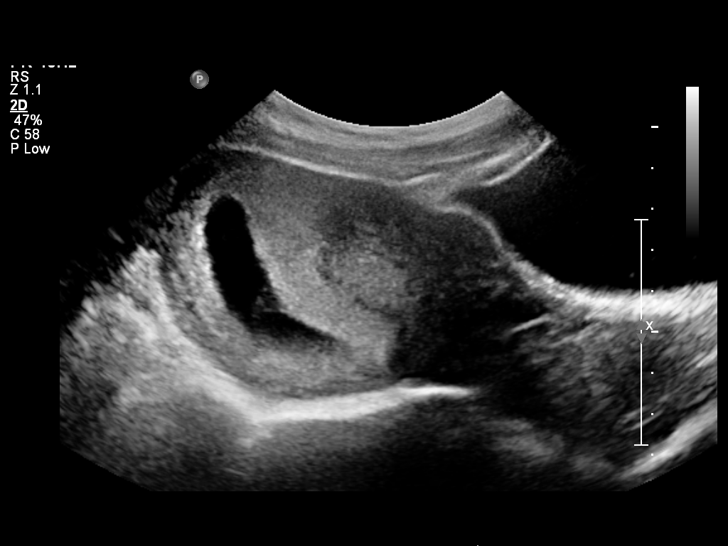
[im 6/20]
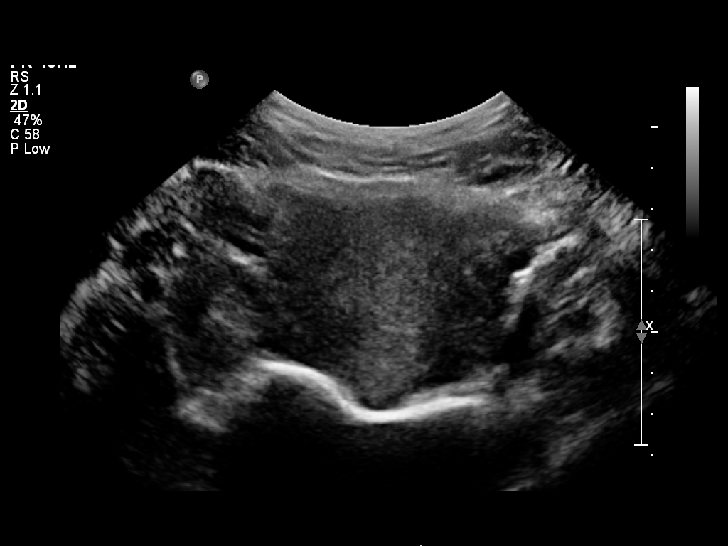
[im 7/20]
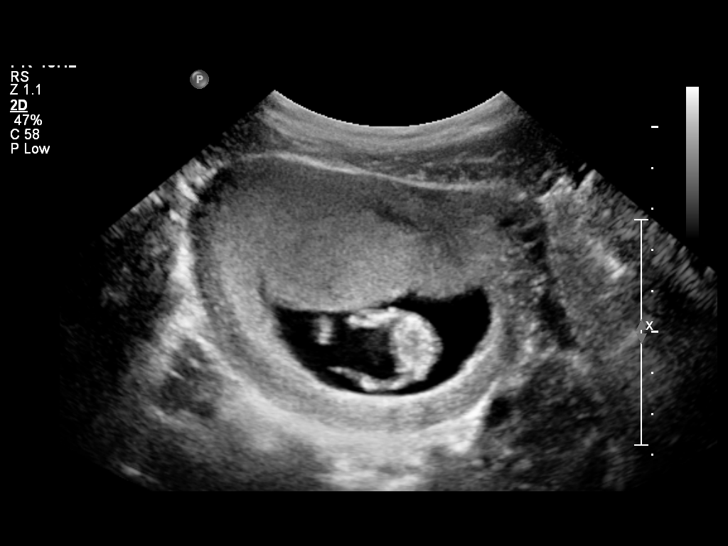
[im 8/20]
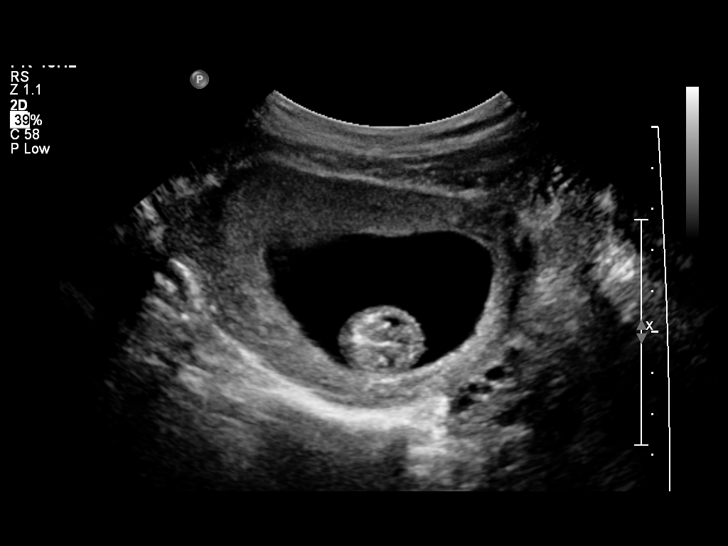
[im 10/20]
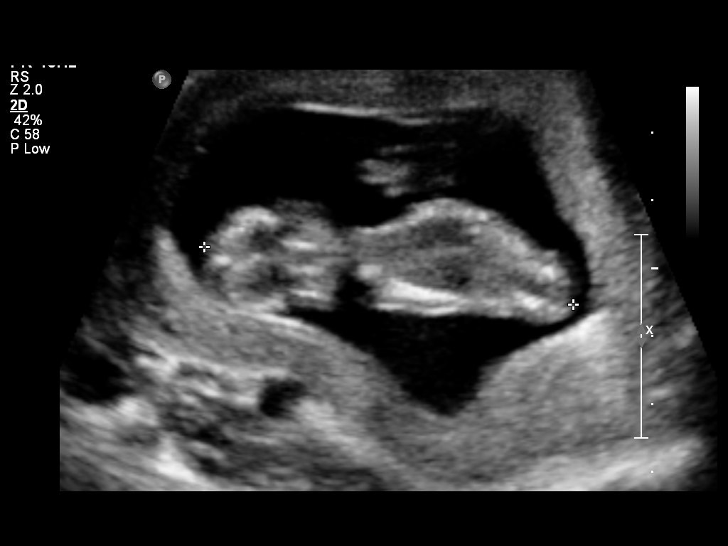
[im 11/20]
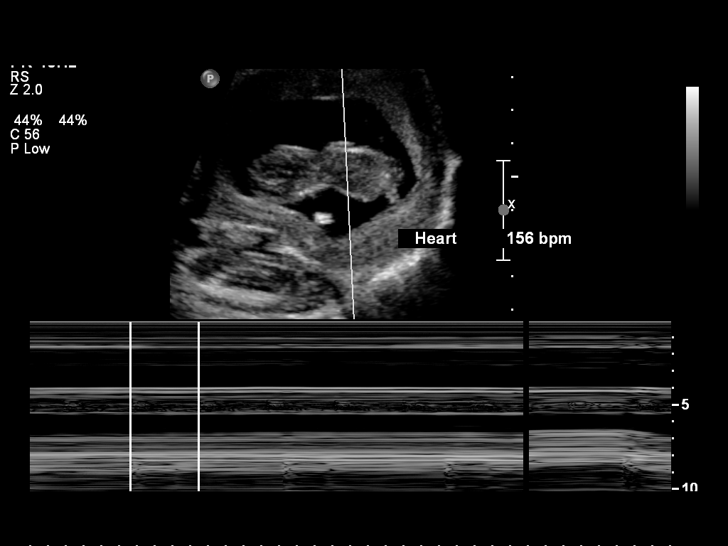
[im 13/20]
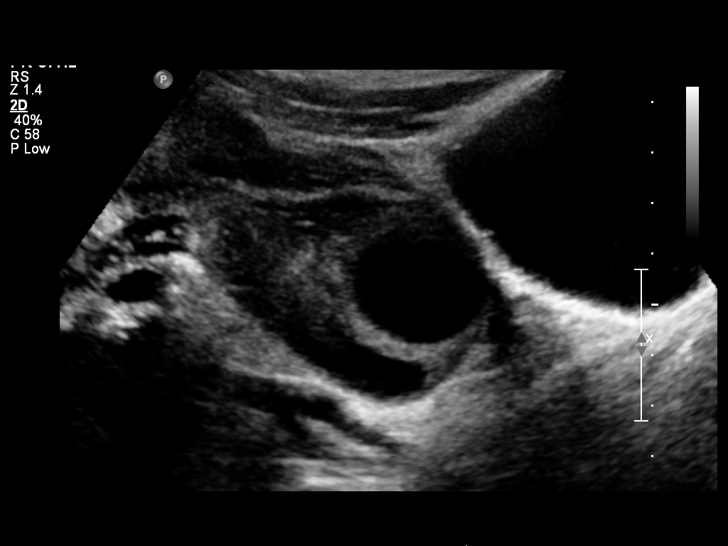
[im 14/20]
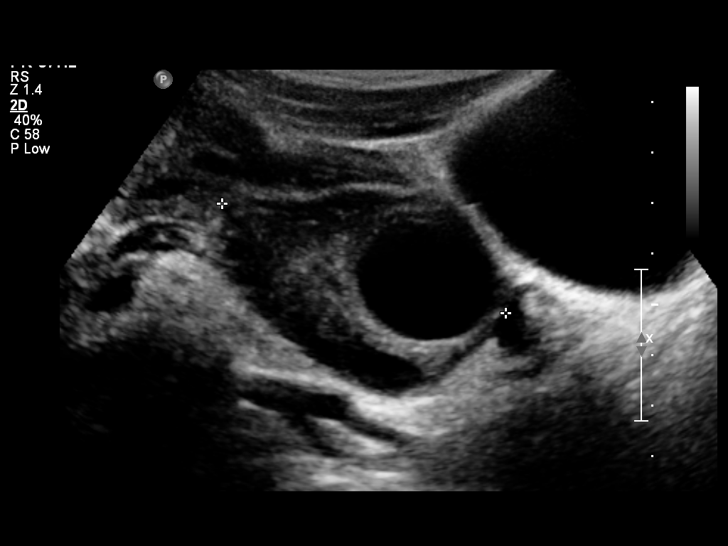
[im 16/20]
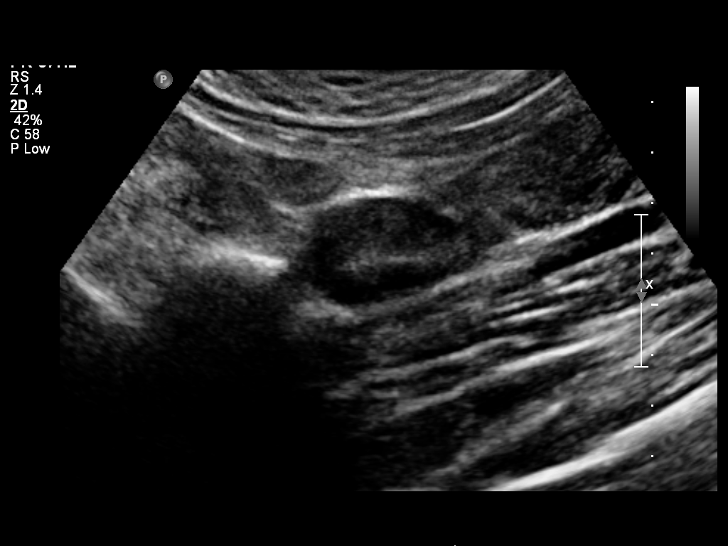
[im 17/20]
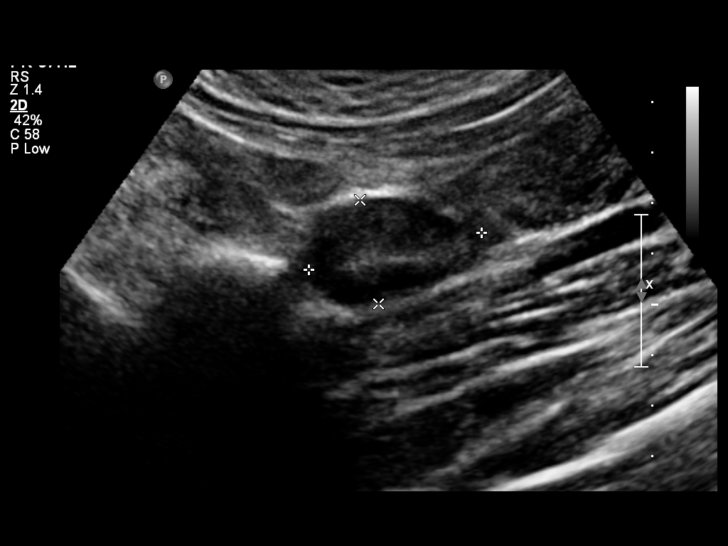
[im 18/20]
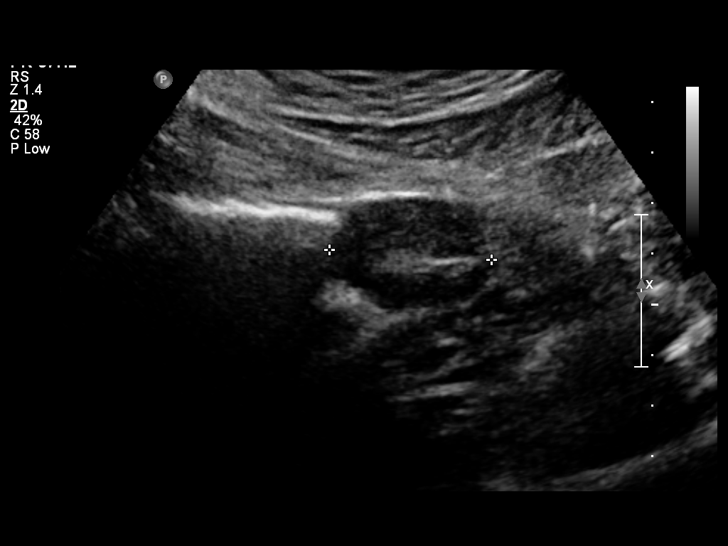
[im 20/20]
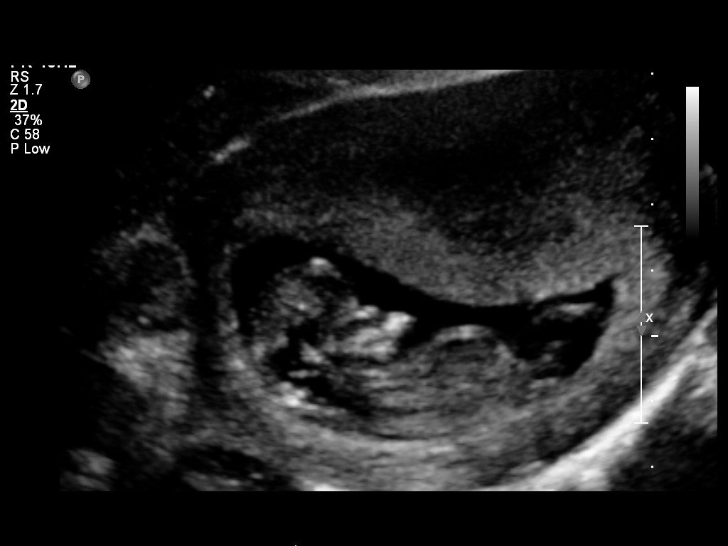

[14 of 20 positions shown; findings below may reference images not displayed]

Intrauterine gestational sac: Visualized/normal in shape.
Yolk sac: Present
Embryo: Present
Cardiac Activity: Present
Heart Rate: 156 bpm

CRL:  54 mm  12 w  1 d        US EDC: 10/10/2013

Maternal uterus/Adnexae:
There is no evidence of subchorionic hemorrhage.
The ovaries bilaterally are unremarkable
There is no evidence of free fluid or solid adnexal mass.
IMPRESSION: Single living intrauterine gestation with estimated gestational age
of 12 weeks 1 day by this ultrasound.

No evidence of subchorionic hemorrhage.

## 2014-10-04 ENCOUNTER — Encounter (HOSPITAL_COMMUNITY): Payer: Self-pay | Admitting: Obstetrics and Gynecology

## 2017-08-18 ENCOUNTER — Emergency Department (HOSPITAL_COMMUNITY)
Admission: EM | Admit: 2017-08-18 | Discharge: 2017-08-18 | Disposition: A | Discharge status: Home / Self Care | Payer: Self-pay | Attending: Emergency Medicine | Admitting: Emergency Medicine

## 2017-08-18 ENCOUNTER — Encounter (HOSPITAL_COMMUNITY): Payer: Self-pay | Admitting: Emergency Medicine

## 2017-08-18 DIAGNOSIS — L0291 Cutaneous abscess, unspecified: Secondary | ICD-10-CM

## 2017-08-18 DIAGNOSIS — Z79899 Other long term (current) drug therapy: Secondary | ICD-10-CM | POA: Insufficient documentation

## 2017-08-18 DIAGNOSIS — Z87891 Personal history of nicotine dependence: Secondary | ICD-10-CM | POA: Insufficient documentation

## 2017-08-18 DIAGNOSIS — L02412 Cutaneous abscess of left axilla: Secondary | ICD-10-CM | POA: Insufficient documentation

## 2017-08-18 MED ORDER — LIDOCAINE-EPINEPHRINE (PF) 2 %-1:200000 IJ SOLN
20.0000 mL | Freq: Once | INTRAMUSCULAR | Status: DC
  Filled 2017-08-18: qty 20

## 2017-08-18 MED ORDER — LIDOCAINE HCL (PF) 2 % IJ SOLN
20.0000 mL | Freq: Once | INTRAMUSCULAR | Status: AC
  Administered 2017-08-18: 20 mL
  Filled 2017-08-18: qty 20

## 2017-08-18 MED ORDER — SULFAMETHOXAZOLE-TRIMETHOPRIM 800-160 MG PO TABS: 1.0000 | ORAL_TABLET | Freq: Two times a day (BID) | ORAL | 0 refills | Status: AC

## 2017-08-18 MED ORDER — HYDROCODONE-ACETAMINOPHEN 5-325 MG PO TABS
1.0000 | ORAL_TABLET | Freq: Once | ORAL | Status: AC
  Administered 2017-08-18: 1 via ORAL
  Filled 2017-08-18: qty 1

## 2017-08-18 MED ORDER — IBUPROFEN 600 MG PO TABS: 600.0000 mg | ORAL_TABLET | Freq: Four times a day (QID) | ORAL | 0 refills | Status: DC | PRN

## 2017-08-18 MED ORDER — BACITRACIN ZINC 500 UNIT/GM EX OINT
TOPICAL_OINTMENT | Freq: Once | CUTANEOUS | Status: AC
  Administered 2017-08-18: 08:00:00 via TOPICAL
  Filled 2017-08-18: qty 0.9

## 2017-08-18 NOTE — ED Provider Notes (Signed)
[facility]-EMERGENCY DEPT Provider Note   CSN: [id number] Arrival date & time: 08/18/17  0435     History   Chief Complaint Chief Complaint  Patient presents with  . Abscess    HPI [name] is a 31 y.o. female.  The history is provided by the patient and medical records. No language interpreter was used.  Abscess  Associated symptoms: no fever    [name] is a 31 y.o. female who presents to ED for pain under left armpit x 2 days. And states that she believes is a boil. She has tried warm compresses and ibuprofen with little relief. Pain is worse when she puts her arm down or applies pressure. No history of similar. No fever or chills.   Past Medical History:  Diagnosis Date  . Chlamydia   . Gonorrhea     There are no active problems to display for this patient.   Past Surgical History:  Procedure Laterality Date  . DILATION AND EVACUATION     Pt reports that she has had 6 surgical elective ABs - pt disclosed this to sonographer and stated that she did not want anyone else to know  . DILATION AND EVACUATION N/A 06/04/2013   Procedure: DILATATION AND EVACUATION;  Surgeon: Lavina Hamman, MD;  Location: [facility];  Service: Gynecology;  Laterality: N/A;    OB History    Gravida Para Term Preterm AB Living   SAB TAB Ectopic Multiple Live Births     6     2       Home Medications    Prior to Admission medications   Medication Sig Start Date End Date Taking? Authorizing Provider  ferrous sulfate 325 (65 FE) MG tablet Take 1 tablet (325 mg total) by mouth daily with breakfast. 06/05/13   Tracey Harries, MD  ibuprofen (ADVIL,MOTRIN) 600 MG tablet Take 1 tablet (600 mg total) by mouth every 6 (six) hours as needed for moderate pain. 08/18/17   Ward, Chase Picket, PA-C  oxyCODONE-acetaminophen (PERCOCET/ROXICET) 5-325 MG per tablet Take 1 tablet by mouth every 6 (six) hours as needed. 06/05/13   Tracey Harries, MD  Prenatal Vit-Fe Fumarate-FA (PRENATAL  MULTIVITAMIN) TABS Take 1 tablet by mouth daily at 12 noon.    [provider]  sulfamethoxazole-trimethoprim (BACTRIM DS,SEPTRA DS) 800-160 MG tablet Take 1 tablet by mouth 2 (two) times daily. 08/18/17 08/25/17  Ward, Chase Picket, PA-C    Family History Family History  Problem Relation Age of Onset  . Heart disease Father   . Cancer Father   . Cancer Maternal Grandfather        lung    Social History Social History  Substance Use Topics  . Smoking status: Former Smoker    Packs/day: 0.10    Types: Cigarettes  . Smokeless tobacco: Former Neurosurgeon    Quit date: 03/04/2013  . Alcohol use Yes     Comment: not since pregnancy confirmed     Allergies   Patient has no known allergies.   Review of Systems Review of Systems  Constitutional: Negative for chills and fever.  Skin: Positive for wound.     Physical Exam Updated Vital Signs BP 125/84 (BP Location: Left Arm)   Pulse 71   Temp 97.8 F (36.6 C) (Oral)   Resp 18   Ht  (1.626 m)   Wt 86.2 kg (190 lb)   SpO2 100%   BMI 32.61 kg/m  Physical Exam  Constitutional: She appears well-developed and well-nourished. No distress.  HENT:  Head: Normocephalic and atraumatic.  Neck: Neck supple.  Cardiovascular: Normal rate, regular rhythm and normal heart sounds.   No murmur heard. Pulmonary/Chest: Effort normal and breath sounds normal. No respiratory distress. She has no wheezes. She has no rales.  Musculoskeletal: Normal range of motion.  Neurological: She is alert.  Skin: Skin is warm and dry.  2x2 area of fluctuance under left axilla with no overlying skin changes.   Nursing note and vitals reviewed.    ED Treatments / Results  Labs (all labs ordered are listed, but only abnormal results are displayed) Labs Reviewed - No data to display  EKG  EKG Interpretation None       Radiology No results found.  Procedures Procedures (including critical care time)  INCISION AND  DRAINAGE Performed by: Chase Picket Ward Consent: Verbal consent obtained. Risks and benefits: risks, benefits and alternatives were discussed Time out performed prior to procedure Type: abscess Body area: Left axilla Anesthesia: local infiltration Incision was made with a scalpel. Local anesthetic: lidocaine 2% without epinephrine Anesthetic total: 5 ml Complexity: complex Blunt dissection to break up loculations Drainage: purulent Drainage amount: moderate Packing material: yes Patient tolerance: Patient tolerated the procedure well with no immediate complications.   Medications Ordered in ED Medications  lidocaine (XYLOCAINE) 2 % injection 20 mL (20 mLs Infiltration Given by Other 08/18/17 0715)  bacitracin ointment ( Topical Given 08/18/17 0817)  HYDROcodone-acetaminophen (NORCO/VICODIN) 5-325 MG per tablet 1 tablet (1 tablet Oral Given 08/18/17 1610)     Initial Impression / Assessment and Plan / ED Course  I have reviewed the triage vital signs and the nursing notes.  Pertinent labs & imaging results that were available during my care of the patient were reviewed by me and considered in my medical decision making (see chart for details).     [name] is a 31 y.o. female who presents to ED for abscess requiring incision and drainage. I&D performed per procedure note above. Patient tolerated the procedure well.   No evidence of surrounding erythema to suggest cellulitis.Wound care instructions discussed.  Wound check in 2-3 days. Return to ER if concern for spread of infection, increasing pain, fevers or other concerns. All questions answered.   Final Clinical Impressions(s) / ED Diagnoses   Final diagnoses:  Abscess    New Prescriptions Discharge Medication List as of 08/18/2017  7:33 AM    START taking these medications   Details  sulfamethoxazole-trimethoprim (BACTRIM DS,SEPTRA DS) 800-160 MG tablet Take 1 tablet by mouth 2 (two) times daily., Starting Sun  08/18/2017, Until Sun 08/25/2017, Print         Ward, Chase Picket, PA-C 08/18/17 9604    Nicanor Alcon, April, MD 08/19/17 0130

## 2017-08-18 NOTE — ED Notes (Signed)
Patient made aware not to drive for 4-6 hours. Reports she will not be driving.

## 2017-08-18 NOTE — ED Triage Notes (Signed)
Patient has a boil under the left axilla. Patient has had it for two days. Patient states that she is having a hard time putting arm down.

## 2017-08-18 NOTE — ED Notes (Signed)
Bed: WTR9 Expected date:  Expected time:  Means of arrival:  Comments: 

## 2017-08-18 NOTE — Discharge Instructions (Signed)
Please take all of your antibiotics until finished!   You may develop abdominal discomfort or diarrhea from the antibiotic. You may help offset this with probiotics which you can buy or get in yogurt.   Ibuprofen as needed for pain.  Follow up with your doctor, an urgent care, or return to ED in order to remove your packing in 48-72 hours. Return to the emergency department if you develop a fever, your abscess appears to become infected (growing surrounding redness and warmth), new or worsening symptoms develop, any additional concerns.   Abscess An abscess (boil or furuncle) is an infected area that contains a collection of pus.   SYMPTOMS Signs and symptoms of an abscess include pain, tenderness, redness, or hardness. You may feel a moveable soft area under your skin. An abscess can occur anywhere in the body.   TREATMENT  A surgical cut (incision) may be made over your abscess to drain the pus. Gauze may be packed into the space or a drain may be looped through the abscess cavity (pocket). This provides a drain that will allow the cavity to heal from the inside outwards. The abscess may be painful for a few days, but should feel much better if it was drained.  Your abscess, if seen early, may not have localized and may not have been drained. If not, another appointment may be required if it does not get better on its own or with medications.  HOME CARE INSTRUCTIONS  Keep the skin and clothes clean around your abscess.  If the abscess was drained, you will need to use gauze dressing to collect any draining pus. Dressings will typically need to be changed 3 or more times a day.  The infection may spread by skin contact with others. Avoid skin contact as much as possible.  Practice good hygiene. This includes regular hand washing, cover any draining skin lesions, and do not share personal care items.  SEEK MEDICAL CARE IF:  You develop increased pain, swelling, redness, drainage, or bleeding in  the wound site.  You develop signs of generalized infection including muscle aches, chills, fever, or a general ill feeling.  You have an oral temperature above 102 F (38.9 C).  MAKE SURE YOU:  Understand these instructions.  Will watch your condition.  Will get help right away if you are not doing well or get worse.  Document Released: 08/29/2005 Document Revised: 08/01/2011 Document Reviewed: 06/22/2008 [facility] Patient Information 2012 [place], [place].

## 2017-09-04 ENCOUNTER — Encounter: Payer: Self-pay | Admitting: Family Medicine

## 2017-10-08 ENCOUNTER — Encounter: Payer: Self-pay | Admitting: Obstetrics and Gynecology

## 2017-10-08 ENCOUNTER — Ambulatory Visit (INDEPENDENT_AMBULATORY_CARE_PROVIDER_SITE_OTHER): Payer: Commercial Managed Care - PPO | Admitting: Obstetrics and Gynecology

## 2017-10-08 VITALS — BP 136/77 | HR 72 | Wt 205.0 lb

## 2017-10-08 DIAGNOSIS — Z30432 Encounter for removal of intrauterine contraceptive device: Secondary | ICD-10-CM

## 2017-10-08 NOTE — Progress Notes (Signed)
GYNECOLOGY CLINIC PROCEDURE NOTE  [name] is a 31 y.o. [phone] here for IUD removal. No GYN concerns.  Patient plans to return for pap smear next month  IUD Removal  Patient identified, informed consent performed, consent signed.  Patient was in the dorsal lithotomy position, normal external genitalia was noted.  A speculum was placed in the patient's vagina, normal discharge was noted, no lesions. The cervix was visualized, no lesions, no abnormal discharge.  The strings of the IUD were grasped and pulled using ring forceps. The IUD was removed in its entirety. Patient tolerated the procedure well.    Patient plans for pregnancy soon and she was told to avoid teratogens, take PNV and folic acid.  Routine preventative health maintenance measures emphasized.

## 2018-09-16 ENCOUNTER — Ambulatory Visit (INDEPENDENT_AMBULATORY_CARE_PROVIDER_SITE_OTHER): Payer: Commercial Managed Care - PPO

## 2018-09-16 DIAGNOSIS — N912 Amenorrhea, unspecified: Secondary | ICD-10-CM

## 2018-09-16 DIAGNOSIS — Z3202 Encounter for pregnancy test, result negative: Secondary | ICD-10-CM

## 2018-09-16 LAB — POCT URINE PREGNANCY: PREG TEST UR: NEGATIVE

## 2018-09-16 NOTE — Progress Notes (Signed)
Ms. Teagle presents today for UPT. Pt is taking reproductive pills purchased online. She is trying to conceive.   LMP: 07/30/18    OBJECTIVE: Appears well, in no apparent distress.  OB History    Gravida  8   Para  2   Term  1   Preterm  1   AB  6   Living  2     SAB      TAB  6   Ectopic      Multiple      Live Births  2          Home UPT Result: neg In-Office UPT result: neg I have reviewed the patient's medical, obstetrical, social, and family histories, and medications.   ASSESSMENT: Neg pregnancy test  PLAN Pt to schedule an appt with provider to discuss amenorrhea.

## 2018-09-30 ENCOUNTER — Encounter: Payer: Self-pay | Admitting: Obstetrics and Gynecology

## 2018-09-30 ENCOUNTER — Other Ambulatory Visit: Payer: Self-pay

## 2018-09-30 ENCOUNTER — Ambulatory Visit (INDEPENDENT_AMBULATORY_CARE_PROVIDER_SITE_OTHER): Payer: Commercial Managed Care - PPO | Admitting: Obstetrics and Gynecology

## 2018-09-30 VITALS — BP 123/78 | HR 62 | Ht 63.0 in | Wt 204.0 lb

## 2018-09-30 DIAGNOSIS — Z113 Encounter for screening for infections with a predominantly sexual mode of transmission: Secondary | ICD-10-CM | POA: Diagnosis not present

## 2018-09-30 DIAGNOSIS — Z1151 Encounter for screening for human papillomavirus (HPV): Secondary | ICD-10-CM

## 2018-09-30 DIAGNOSIS — Z01419 Encounter for gynecological examination (general) (routine) without abnormal findings: Secondary | ICD-10-CM | POA: Diagnosis not present

## 2018-09-30 DIAGNOSIS — Z124 Encounter for screening for malignant neoplasm of cervix: Secondary | ICD-10-CM | POA: Diagnosis not present

## 2018-09-30 DIAGNOSIS — Z3202 Encounter for pregnancy test, result negative: Secondary | ICD-10-CM | POA: Diagnosis not present

## 2018-09-30 DIAGNOSIS — Z01411 Encounter for gynecological examination (general) (routine) with abnormal findings: Secondary | ICD-10-CM

## 2018-09-30 LAB — POCT URINE PREGNANCY: Preg Test, Ur: NEGATIVE

## 2018-09-30 NOTE — Progress Notes (Signed)
Subjective:     [name] is a 32 y.o. female [phone] with BMI and LMP 07/29/2018 who is here for a comprehensive physical exam. The patient reports amenorrhe for the past 2 months. She is actively seeking pregnancy. She reports having a period monthly lasting 3-4 days. She started taking a "conceive kit" and reports amenorrhea since with negative pregnancy tests. She stopped taking these pills in early October. She denies pelvic pain or abnormal discharge.   Past Medical History:  Diagnosis Date  . Chlamydia   . Gonorrhea    Past Surgical History:  Procedure Laterality Date  . DILATION AND EVACUATION     Pt reports that she has had 6 surgical elective ABs - pt disclosed this to sonographer and stated that she did not want anyone else to know  . DILATION AND EVACUATION N/A 06/04/2013   Procedure: DILATATION AND EVACUATION;  Surgeon: Cheri Fowler, MD;  Location: [place] [facility];  Service: Gynecology;  Laterality: N/A;   Family History  Problem Relation Age of Onset  . Heart disease Father   . Cancer Father   . Cancer Maternal Grandfather        lung    Social History   Socioeconomic History  . Marital status: Married    Spouse name: Not on file  . Number of children: Not on file  . Years of education: Not on file  . Highest education level: Not on file  Occupational History  . Not on file  Social Needs  . Financial resource strain: Not on file  . Food insecurity:    Worry: Not on file    Inability: Not on file  . Transportation needs:    Medical: Not on file    Non-medical: Not on file  Tobacco Use  . Smoking status: Former Smoker    Packs/day: 0.10    Types: Cigarettes  . Smokeless tobacco: Former Systems developer    Quit date: 03/04/2013  Substance and Sexual Activity  . Alcohol use: Yes    Comment: not since pregnancy confirmed  . Drug use: No  . Sexual activity: Yes    Birth control/protection: None  Lifestyle  . Physical activity:    Days per week: Not on file    Minutes  per session: Not on file  . Stress: Not on file  Relationships  . Social connections:    Talks on phone: Not on file    Gets together: Not on file    Attends religious service: Not on file    Active member of club or organization: Not on file    Attends meetings of clubs or organizations: Not on file    Relationship status: Not on file  . Intimate partner violence:    Fear of current or ex partner: Not on file    Emotionally abused: Not on file    Physically abused: Not on file    Forced sexual activity: Not on file  Other Topics Concern  . Not on file  Social History Narrative  . Not on file   Health Maintenance  Topic Date Due  . TETANUS/TDAP  12/29/2003  . PAP SMEAR  12/28/2005  . INFLUENZA VACCINE  07/03/2018  . HIV Screening  Completed       Review of Systems Pertinent items are noted in HPI.   Objective:  Blood pressure 123/78, pulse 62, height 5' 3"  (1.6 m), weight 204 lb (92.5 kg), last menstrual period 07/29/2018, unknown if currently breastfeeding.  GENERAL: Well-developed, well-nourished female in no acute distress.  HEENT: Normocephalic, atraumatic. Sclerae anicteric.  NECK: Supple. Normal thyroid.  LUNGS: Clear to auscultation bilaterally.  HEART: Regular rate and rhythm. BREASTS: Symmetric in size. No palpable masses or lymphadenopathy, skin changes, or nipple drainage. ABDOMEN: Soft, nontender, nondistended. No organomegaly. PELVIC: Normal external female genitalia. Vagina is pink and rugated.  Normal discharge. Normal appearing cervix. Uterus is normal in size. No adnexal mass or tenderness. EXTREMITIES: No cyanosis, clubbing, or edema, 2+ distal pulses.    Assessment:    Healthy female exam.     UPT- negative Plan:    paps smear and cultures collected. Patient reports STI screen with PCP Will check TSH and A1c Start prenatal vitamins Patient will be contacted with abnormal results Reviewed timing of conception with patient Ingredient list  of conceive kit not available on the internet- advised to stop taking Discussed progesterone challenge test- patient would like to defer until November if amenorhea persists See After Visit Summary for Counseling Recommendations

## 2018-09-30 NOTE — Addendum Note (Signed)
Addended by: Maretta Bees on: 09/30/2018 09:09 AM   Modules accepted: Orders

## 2018-09-30 NOTE — Progress Notes (Signed)
Presents for AEX/PAP. Last PAP was more than 2 yrs ago.  LMP 09/28/18.  C/o missing periods. Had UPT done here 09/16/18 -NEG.  She is fatigue x 1 month.  She has been trying to conceive for 1 year.  UPT today is Negative.

## 2018-10-01 LAB — TSH: TSH: 1.1 u[IU]/mL (ref 0.450–4.500)

## 2018-10-01 LAB — HEMOGLOBIN A1C
ESTIMATED AVERAGE GLUCOSE: 108 mg/dL
Hgb A1c MFr Bld: 5.4 % (ref 4.8–5.6)

## 2018-10-02 LAB — CYTOLOGY - PAP
Chlamydia: NEGATIVE
Diagnosis: NEGATIVE
HPV (WINDOPATH): NOT DETECTED
Neisseria Gonorrhea: NEGATIVE

## 2019-09-03 ENCOUNTER — Encounter: Payer: Self-pay | Admitting: Family Medicine

## 2019-09-03 ENCOUNTER — Other Ambulatory Visit: Payer: Self-pay

## 2019-09-03 ENCOUNTER — Ambulatory Visit (INDEPENDENT_AMBULATORY_CARE_PROVIDER_SITE_OTHER): Payer: Commercial Managed Care - PPO | Admitting: Family Medicine

## 2019-09-03 VITALS — BP 120/80 | HR 64 | Temp 98.4°F | Ht 64.5 in | Wt 196.2 lb

## 2019-09-03 DIAGNOSIS — Z1329 Encounter for screening for other suspected endocrine disorder: Secondary | ICD-10-CM | POA: Diagnosis not present

## 2019-09-03 DIAGNOSIS — Z0000 Encounter for general adult medical examination without abnormal findings: Secondary | ICD-10-CM

## 2019-09-03 DIAGNOSIS — Z13220 Encounter for screening for lipoid disorders: Secondary | ICD-10-CM | POA: Diagnosis not present

## 2019-09-03 DIAGNOSIS — E669 Obesity, unspecified: Secondary | ICD-10-CM | POA: Diagnosis not present

## 2019-09-03 NOTE — Progress Notes (Signed)
Subjective:    Patient ID: [name], female    DOB: 1986-05-02, 33 y.o.   MRN: [id number]  HPI Chief Complaint  Patient presents with  . new pt    new pt cpe. sees obgyn. will get flu shot at work.    She is new to the practice and here for a complete physical exam. 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 Flu shot at work.    Health maintenance:  Mammogram: N/A Colonoscopy: N/A Last Gynecological Exam: November 2019 Last Menstrual cycle: periods are regular  Last Dental Exam: last month  Last Eye Exam: October 2019- glasses   Wears seatbelt always, smoke detectors in home and functioning, does not text while driving and feels safe in home environment.   Reviewed allergies, medications, past medical, surgical, family, and social history.   Review of Systems Review of Systems Constitutional: -fever, -chills, -sweats, -unexpected weight change,-fatigue ENT: -runny nose, -ear pain, -sore throat Cardiology:  -chest pain, -palpitations, -edema Respiratory: -cough, -shortness of breath, -wheezing Gastroenterology: -abdominal pain, -nausea, -vomiting, -diarrhea, -constipation  Hematology: -bleeding or bruising problems Musculoskeletal: -arthralgias, -myalgias, -joint swelling, -back pain Ophthalmology: -vision changes Urology: -dysuria, -difficulty urinating, -hematuria, -urinary frequency, -urgency Neurology: -headache, -weakness, -tingling, -numbness       Objective:   Physical Exam BP 120/80   Pulse 64   Temp 98.4 F (36.9 C)   Ht 5' 4.5" (1.638 m)   Wt 196 lb 3.2 oz (89 kg)   BMI 33.16 kg/m    General Appearance:    Alert, cooperative, no distress, appears stated age  Head:    Normocephalic, without obvious abnormality, atraumatic  Eyes:    PERRL, conjunctiva/corneas clear, EOM's intact, fundi    benign  Ears:    Normal TM's and external ear canals  Nose:   Mask in place   Throat:   Mask in place   Neck:   Supple, no lymphadenopathy;  thyroid:  no   enlargement/tenderness/nodules; no carotid   bruit or JVD  Back:    Spine nontender, no curvature, ROM normal, no CVA     tenderness  Lungs:     Clear to auscultation bilaterally without wheezes, rales or     ronchi; respirations unlabored  Chest Wall:    No tenderness or deformity   Heart:    Regular rate and rhythm, S1 and S2 normal, no murmur, rub   or gallop  Breast Exam:    OB/GYN   Abdomen:     Soft, non-tender, nondistended, normoactive bowel sounds,    no masses, no hepatosplenomegaly  Genitalia:    OB/GYN   Rectal:    Not performed due to age<40 and no related complaints  Extremities:   No clubbing, cyanosis or edema  Pulses:   2+ and symmetric all extremities  Skin:   Skin color, texture, turgor normal, no rashes. She has a raised linear birthmark to medial right nose.   Lymph nodes:   Cervical, supraclavicular, and axillary nodes normal  Neurologic:   CNII-XII intact, normal strength, sensation and gait; reflexes 2+ and symmetric throughout  Psych:   Normal mood, affect, hygiene and grooming.        Assessment & Plan:  Routine general medical examination at a health care facility - Plan: CBC with Differential/Platelet, Comprehensive metabolic panel, TSH, T4, free, T3, Lipid panel - here today as a new patient for a fasting CPE. Discussed preventive health and she is up to date on pap smear, has an OB/GYN. Attempting to conceive. Immunizations up to date per patient but I do not have these on file. Discussed safety.   Obesity (BMI 30.0-34.9)- counseling on making healthy diet changes and increasing physical  activity. Encouraged her to try a free app such as my fitness pal. Avoid eating late at night.   Screening for lipid disorders - Plan: Lipid panel  Screening for thyroid disorder - Plan: TSH, T4, free, T3

## 2019-09-03 NOTE — Patient Instructions (Addendum)
It was a pleasure meeting you today. Thanks for trusting [place] with your healthcare.   Try using a free app such as My Fitness Pal to track your calories and carbohydrates.   Cut back on sugar.  Increase fresh fruit, salads, vegetables. Eat broiled, baked or grilled lean meats and avoid fried foods.   Try to increase your physical activity. It is recommended that you get at least 150 minutes of physical activity each week.   Make sure you are taking a pre-natal vitamin with folic acid.   We will forward your lab results to you.    Preventive Care 34-37 Years Old, Female Preventive care refers to visits with your health care provider and lifestyle choices that can promote health and wellness. This includes:  A yearly physical exam. This may also be called an annual well check.  Regular dental visits and eye exams.  Immunizations.  Screening for certain conditions.  Healthy lifestyle choices, such as eating a healthy diet, getting regular exercise, not using drugs or products that contain nicotine and tobacco, and limiting alcohol use. What can I expect for my preventive care visit? Physical exam Your health care provider will check your:  Height and weight. This may be used to calculate body mass index (BMI), which tells if you are at a healthy weight.  Heart rate and blood pressure.  Skin for abnormal spots. Counseling Your health care provider may ask you questions about your:  Alcohol, tobacco, and drug use.  Emotional well-being.  Home and relationship well-being.  Sexual activity.  Eating habits.  Work and work Statistician.  Method of birth control.  Menstrual cycle.  Pregnancy history. What immunizations do I need?  Influenza (flu) vaccine  This is recommended every year. Tetanus, diphtheria, and pertussis (Tdap) vaccine  You may need a Td booster every 10 years. Varicella (chickenpox) vaccine  You may need this if you have not been vaccinated.  Human papillomavirus (HPV) vaccine  If recommended by your health care provider, you may need three doses over 6 months. Measles, mumps, and rubella (MMR) vaccine  You may need at least one dose of MMR. You may also need a second dose. Meningococcal conjugate (MenACWY) vaccine  One dose is recommended if you are age 17-21 years and a first-year college student living in a residence hall, or if you have one of several medical conditions. You may also need additional booster doses. Pneumococcal conjugate (PCV13) vaccine  You may need this if you have certain conditions and were not previously vaccinated. Pneumococcal polysaccharide (PPSV23) vaccine  You may need one or two doses if you smoke cigarettes or if you have certain conditions. Hepatitis A vaccine  You may need this if you have certain conditions or if you travel or work in places where you may be exposed to hepatitis A. Hepatitis B vaccine  You may need this if you have certain conditions or if you travel or work in places where you may be exposed to hepatitis B. Haemophilus influenzae type b (Hib) vaccine  You may need this if you have certain conditions. You may receive vaccines as individual doses or as more than one vaccine together in one shot (combination vaccines). Talk with your health care provider about the risks and benefits of combination vaccines. What tests do I need?  Blood tests  Lipid and cholesterol levels. These may be checked every 5 years starting at age 34.  Hepatitis C test.  Hepatitis B test. Screening  Diabetes screening. This  is done by checking your blood sugar (glucose) after you have not eaten for a while (fasting).  Sexually transmitted disease (STD) testing.  BRCA-related cancer screening. This may be done if you have a family history of breast, ovarian, tubal, or peritoneal cancers.  Pelvic exam and Pap test. This may be done every 3 years starting at age 34. Starting at age 34, this  may be done every 5 years if you have a Pap test in combination with an HPV test. Talk with your health care provider about your test results, treatment options, and if necessary, the need for more tests. Follow these instructions at home: Eating and drinking   Eat a diet that includes fresh fruits and vegetables, whole grains, lean protein, and low-fat dairy.  Take vitamin and mineral supplements as recommended by your health care provider.  Do not drink alcohol if: ? Your health care provider tells you not to drink. ? You are pregnant, may be pregnant, or are planning to become pregnant.  If you drink alcohol: ? Limit how much you have to 0-1 drink a day. ? Be aware of how much alcohol is in your drink. In the [place] one drink equals one 12 oz bottle of beer (355 mL), one 5 oz glass of wine (148 mL), or one 1 oz glass of hard liquor (44 mL). Lifestyle  Take daily care of your teeth and gums.  Stay active. Exercise for at least 30 minutes on 5 or more days each week.  Do not use any products that contain nicotine or tobacco, such as cigarettes, e-cigarettes, and chewing tobacco. If you need help quitting, ask your health care provider.  If you are sexually active, practice safe sex. Use a condom or other form of birth control (contraception) in order to prevent pregnancy and STIs (sexually transmitted infections). If you plan to become pregnant, see your health care provider for a preconception visit. What's next?  Visit your health care provider once a year for a well check visit.  Ask your health care provider how often you should have your eyes and teeth checked.  Stay up to date on all vaccines. This information is not intended to replace advice given to you by your health care provider. Make sure you discuss any questions you have with your health care provider. Document Released: 01/15/2002 Document Revised: 07/31/2018 Document Reviewed: 07/31/2018 Elsevier Patient Education   2020 [employer].

## 2019-09-04 LAB — COMPREHENSIVE METABOLIC PANEL
ALT: 13 IU/L (ref 0–32)
AST: 21 IU/L (ref 0–40)
Albumin/Globulin Ratio: 1.3 (ref 1.2–2.2)
Albumin: 3.9 g/dL (ref 3.8–4.8)
Alkaline Phosphatase: 73 IU/L (ref 39–117)
BUN/Creatinine Ratio: 16 (ref 9–23)
BUN: 14 mg/dL (ref 6–20)
Bilirubin Total: 0.7 mg/dL (ref 0.0–1.2)
CO2: 22 mmol/L (ref 20–29)
Calcium: 8.7 mg/dL (ref 8.7–10.2)
Chloride: 104 mmol/L (ref 96–106)
Creatinine, Ser: 0.87 mg/dL (ref 0.57–1.00)
GFR calc Af Amer: 101 mL/min/{1.73_m2} (ref >59)
GFR calc non Af Amer: 87 mL/min/{1.73_m2} (ref >59)
Globulin, Total: 3 g/dL (ref 1.5–4.5)
Glucose: 83 mg/dL (ref 65–99)
Potassium: 4.4 mmol/L (ref 3.5–5.2)
Sodium: 139 mmol/L (ref 134–144)
Total Protein: 6.9 g/dL (ref 6.0–8.5)

## 2019-09-04 LAB — CBC WITH DIFFERENTIAL/PLATELET
Basophils Absolute: 0 10*3/uL (ref 0.0–0.2)
Basos: 1 %
EOS (ABSOLUTE): 0.1 10*3/uL (ref 0.0–0.4)
Eos: 2 %
Hematocrit: 41.7 % (ref 34.0–46.6)
Hemoglobin: 13.6 g/dL (ref 11.1–15.9)
Immature Grans (Abs): 0 10*3/uL (ref 0.0–0.1)
Immature Granulocytes: 0 %
Lymphocytes Absolute: 1.4 10*3/uL (ref 0.7–3.1)
Lymphs: 28 %
MCH: 30.3 pg (ref 26.6–33.0)
MCHC: 32.6 g/dL (ref 31.5–35.7)
MCV: 93 fL (ref 79–97)
Monocytes Absolute: 0.2 10*3/uL (ref 0.1–0.9)
Monocytes: 5 %
Neutrophils Absolute: 3.1 10*3/uL (ref 1.4–7.0)
Neutrophils: 64 %
RBC: 4.49 x10E6/uL (ref 3.77–5.28)
RDW: 11.8 % (ref 11.7–15.4)
WBC: 4.9 10*3/uL (ref 3.4–10.8)

## 2019-09-04 LAB — T4, FREE: Free T4: 1.11 ng/dL (ref 0.82–1.77)

## 2019-09-04 LAB — LIPID PANEL
Chol/HDL Ratio: 4.1 ratio (ref 0.0–4.4)
Cholesterol, Total: 215 mg/dL — ABNORMAL HIGH (ref 100–199)
HDL: 53 mg/dL (ref >39)
LDL Chol Calc (NIH): 151 mg/dL — ABNORMAL HIGH (ref 0–99)
Triglycerides: 63 mg/dL (ref 0–149)
VLDL Cholesterol Cal: 11 mg/dL (ref 5–40)

## 2019-09-04 LAB — TSH: TSH: 0.983 u[IU]/mL (ref 0.450–4.500)

## 2019-09-04 LAB — T3: T3, Total: 112 ng/dL (ref 71–180)

## 2019-09-28 ENCOUNTER — Encounter: Payer: Self-pay | Admitting: Internal Medicine

## 2020-02-15 ENCOUNTER — Ambulatory Visit (INDEPENDENT_AMBULATORY_CARE_PROVIDER_SITE_OTHER): Payer: Commercial Managed Care - PPO | Admitting: Medical

## 2020-02-15 ENCOUNTER — Encounter: Payer: Self-pay | Admitting: Medical

## 2020-02-15 ENCOUNTER — Other Ambulatory Visit: Payer: Self-pay

## 2020-02-15 VITALS — BP 120/82 | HR 89 | Temp 98.3°F | Ht 63.0 in | Wt 205.8 lb

## 2020-02-15 DIAGNOSIS — M25472 Effusion, left ankle: Secondary | ICD-10-CM | POA: Insufficient documentation

## 2020-02-15 DIAGNOSIS — M25572 Pain in left ankle and joints of left foot: Secondary | ICD-10-CM

## 2020-02-15 DIAGNOSIS — M7752 Other enthesopathy of left foot: Secondary | ICD-10-CM | POA: Insufficient documentation

## 2020-02-15 DIAGNOSIS — M7662 Achilles tendinitis, left leg: Secondary | ICD-10-CM | POA: Diagnosis not present

## 2020-02-15 MED ORDER — NAPROXEN 500 MG PO TABS: 500.0000 mg | ORAL_TABLET | Freq: Two times a day (BID) | ORAL | 0 refills | Status: DC

## 2020-02-15 NOTE — Patient Instructions (Addendum)
Your exam findings suggest ankle tendonitis and swelling, both the ankle tendons and achilles tendon   Recommendations: RICE rest ice compression elevation  Rest the foot when possible the next 7-10 days, avoid prolonged activity standing.   Avoid lifting up on tip toes for the next week  Ice with bucket of cold water or ice water pack 20 minutes 2-3 times daily the next 3-4 days  Compression with ankle sleeve or ACE wrap this week  Elevate the leg up on pillows when possible to rest the ankle and reduce swelling  Begin Naprosyn anti-inflammatory tablet, 1 tablet twice daily with food for the next 7-10 days.  If not much improved within 7-10 days, then call or recheck.

## 2020-02-15 NOTE — Progress Notes (Signed)
Subjective: Chief Complaint  Patient presents with  . Joint Swelling    left ankle swelling with pain    Here for left ankle swelling and pain.  First noticed discomfort 4 days ago.  Last Thursday started having pain in back of foot/achilles.  Works in a pharmacy, standing long hours.  All weekend pain persisted, but pain radiated around to front of foot.  Last night husband noted it seemed swollen around entire ankle.  Ankle hurts to touch, ankle has throbbing pain.  Denies injury, trauma, fall. Left knee and hip ok, but has hx/o bilat knee pain in general.  No new changes with knees or hips.   Pain seems to radiate up the leg some towards calve.  Has used some ice.  Used ibuprofen last night.  No other aggravating or relieving factors. No other complaint.  ROS as in subjective    Objective: BP 120/82   Pulse 89   Temp 98.3 F (36.8 C)   Ht 5\' 3"  (1.6 m)   Wt 205 lb 12.8 oz (93.4 kg)   SpO2 97%   BMI 36.46 kg/m   Gen: wd, wn, nad, African American female Skin: no skin findings of concern around the left ankle MSK: tender achilles on left, tender left lateral malleolus and PTFL and ATFL, tender deltoid ligament and medial malleolus, otherwise nontender,. There is mild swelling circumferentially around ankle., pain with ankle ROM in general on left which is reduced.  otherwise toes, anterior foot, heel, lower leg, knee and hip nontender and normal ROM without pain Pulses 2+ LE Ext: no edema    Assessment: Encounter Diagnoses  Name Primary?  . Achilles tendinitis of left lower extremity Yes  . Acute left ankle pain   . Left ankle swelling   . Left ankle tendonitis      Plan: Discussed findings without injury.   Begin recommendations below.  Patient Instructions  Your exam findings suggest ankle tendonitis and swelling, both the ankle tendons and achilles tendon   Recommendations: RICE rest ice compression elevation  Rest the foot when possible the next 7-10 days,  avoid prolonged activity standing.   Avoid lifting up on tip toes for the next week  Ice with bucket of cold water or ice water pack 20 minutes 2-3 times daily the next 3-4 days  Compression with ankle sleeve or ACE wrap this week  Elevate the leg up on pillows when possible to rest the ankle and reduce swelling  Begin Naprosyn anti-inflammatory tablet, 1 tablet twice daily with food for the next 7-10 days.  If not much improved within 7-10 days, then call or recheck.     [name] was seen today for joint swelling.  Diagnoses and all orders for this visit:  Achilles tendinitis of left lower extremity  Acute left ankle pain  Left ankle swelling  Left ankle tendonitis  Other orders -     naproxen (NAPROSYN) 500 MG tablet; Take 1 tablet (500 mg total) by mouth 2 (two) times daily with a meal. -     Discontinue: naproxen (NAPROSYN) 500 MG tablet; Take 1 tablet (500 mg total) by mouth 2 (two) times daily with a meal.

## 2020-02-18 ENCOUNTER — Other Ambulatory Visit: Payer: Self-pay | Admitting: Medical

## 2020-02-18 ENCOUNTER — Telehealth: Payer: Self-pay | Admitting: Family Medicine

## 2020-02-18 DIAGNOSIS — M7752 Other enthesopathy of left foot: Secondary | ICD-10-CM

## 2020-02-18 DIAGNOSIS — M25572 Pain in left ankle and joints of left foot: Secondary | ICD-10-CM

## 2020-02-18 DIAGNOSIS — M7662 Achilles tendinitis, left leg: Secondary | ICD-10-CM

## 2020-02-18 DIAGNOSIS — M25472 Effusion, left ankle: Secondary | ICD-10-CM

## 2020-02-18 NOTE — Telephone Encounter (Signed)
Okay, I put in the x-ray order just in case

## 2020-02-18 NOTE — Telephone Encounter (Signed)
Has only been a few days so she still needs to do the same treatment recommendations we discussed.  If she cannot bear weight and I recommend we send her for x-ray of ankle and foot.  Let me know.  The next step after x-ray assuming x-ray is normal with the physical therapy  Send to [facility] if desired

## 2020-02-18 NOTE — Telephone Encounter (Signed)
Pt called and states she is still in a lot of pain, her ankle is not getting any better.  She needs a brace, crutches and a stronger pain medicine.  Please advise pt [phone]

## 2020-02-18 NOTE — Telephone Encounter (Signed)
Patient stated she is able to walk on her foot but it still hurts. She stated she will take your professional opinion and give it some more time. She will call back within 7 days if it does not get better

## 2020-04-12 ENCOUNTER — Telehealth (INDEPENDENT_AMBULATORY_CARE_PROVIDER_SITE_OTHER): Payer: Commercial Managed Care - PPO | Admitting: Medical

## 2020-04-12 ENCOUNTER — Other Ambulatory Visit: Payer: Self-pay

## 2020-04-12 ENCOUNTER — Encounter: Payer: Self-pay | Admitting: Medical

## 2020-04-12 VITALS — Temp 97.6°F | Ht 64.0 in | Wt 205.0 lb

## 2020-04-12 DIAGNOSIS — R079 Chest pain, unspecified: Secondary | ICD-10-CM | POA: Diagnosis not present

## 2020-04-12 DIAGNOSIS — G479 Sleep disorder, unspecified: Secondary | ICD-10-CM | POA: Diagnosis not present

## 2020-04-12 DIAGNOSIS — F419 Anxiety disorder, unspecified: Secondary | ICD-10-CM | POA: Diagnosis not present

## 2020-04-12 DIAGNOSIS — Z8249 Family history of ischemic heart disease and other diseases of the circulatory system: Secondary | ICD-10-CM

## 2020-04-12 DIAGNOSIS — F430 Acute stress reaction: Secondary | ICD-10-CM | POA: Insufficient documentation

## 2020-04-12 DIAGNOSIS — F410 Panic disorder [episodic paroxysmal anxiety] without agoraphobia: Secondary | ICD-10-CM

## 2020-04-12 MED ORDER — PROPRANOLOL HCL 20 MG PO TABS: 20.0000 mg | ORAL_TABLET | Freq: Two times a day (BID) | ORAL | 0 refills | Status: DC

## 2020-04-12 NOTE — Addendum Note (Signed)
Addended by: Jac Canavan on: 04/12/2020 02:16 PM   Modules accepted: Orders

## 2020-04-12 NOTE — Progress Notes (Addendum)
This visit type was conducted due to national recommendations for restrictions regarding the COVID-19 Pandemic (e.g. social distancing) in an effort to limit this patient's exposure and mitigate transmission in our community.  Due to their co-morbid illnesses, this patient is at least at moderate risk for complications without adequate follow up.  This format is felt to be most appropriate for this patient at this time.    Documentation for virtual audio and video telecommunications through Zoom encounter:  The patient was located at home. The provider was located in the office. The patient did consent to this visit and is aware of possible charges through their insurance for this visit.  The other persons participating in this telemedicine service were none. Time spent on call was 40 minutes and in review of previous records >40 minutes total.  This virtual service is not related to other E/M service within previous 7 days.   Subjective: Chief Complaint  Patient presents with  . Anxiety    Virtual consult for concern of stress, chest pain.  For the past few months not feeling herself.  Thoughts all over the place, fidgety, hard to focus.  Not sleeping  well.  Sometimes panic in sleep.  Getting headaches, chest pains, short of breath at times.  She thinks this is all stemming from anxiety years ago when she has tightness in the chest her heart beats fast.    She denies any prior mental health disorder or mental health evaluation.  She denies any family history of mental health issues.  She lives at home with her husband, son 57 years old.  She works full-time.  She works for a [employer], [employer] as a Therapist, sports for the past 3 years.  Stressors include work, Publishing rights manager, marriage, family, friends.  She denies depression.  She feels anxious and fidgety.  She denies mood swings.  No recent life change.  She gets stressed out about life in general and about the state of the  world in general.  At work she has a lot of responsibilities.  Always feels on edge.  She cannot pinpoint her symptoms from 1 specific trigger  She has not seen a Social worker.  Her father is a Company secretary.  She has not been to church lately with Covid shutdown   She also worries about her health because father had heart disease in his 7s but this was partially due to toxic effects of chemotherapy from brain cancer  She is a non-smoker, drinks maybe 2 alcoholic drinks per week.  No drugs.   Current Outpatient Medications on File Prior to Visit  Medication Sig Dispense Refill  . naproxen (NAPROSYN) 500 MG tablet Take 1 tablet (500 mg total) by mouth 2 (two) times daily with a meal. (Patient not taking: Reported on 04/12/2020) 30 tablet 0   No current facility-administered medications on file prior to visit.   ROS as in subjective   Objective: Temp 97.6 F (36.4 C)   Ht 5\' 4"  (1.626 m)   Wt 205 lb (93 kg)   BMI 35.19 kg/m    Gen: wd, wn, nad Not examined in person as this was a virtual consult  EKG shows normal sinus rhythm, rate 67 bpm, 170 ms PR interval, QRS 76 ms, QTC 420 ms, axis 41 degrees, no acute changes.  No prior EKG.    Assessment: Encounter Diagnoses  Name Primary?  . Chest pain, unspecified type Yes  . Anxiety   . Sleep disturbances   . Acute stress  reaction   . Panic   . Family history of heart disease      Plan: We discussed her symptoms and concerns.  We discussed possible causes of her symptoms which is very likely anxiety.  We discussed her physical symptoms.  She will come in for an EKG today.  We discussed ways to start coping with her stressors such as getting exercise daily, spending time with friends, having personal time to relax and unwind.  We discussed the need to see counseling.  I gave her a list of counseling resources so she can make an appointment.  We discussed that she may or may not need medication for and needs to see a counselor  We  discussed focusing on the important things and letting stuff go that are less important or not issue.  We discussed avoiding 24 hour news circuits.  We discussed other coping mechanisms.  Begin short term trial of propranolol for anxiety.  Yenesis was seen today for anxiety.  Diagnoses and all orders for this visit:  Chest pain, unspecified type -     EKG 12-Lead  Anxiety -     EKG 12-Lead  Sleep disturbances  Acute stress reaction  Panic  Family history of heart disease  Other orders -     propranolol (INDERAL) 20 MG tablet; Take 1 tablet (20 mg total) by mouth 2 (two) times daily.  Spent > 45 minutes face to face with patient in discussion of symptoms, evaluation, plan and recommendations.    Addendum she came in later in today for an EKG.  EKG shows normal sinus rhythm, rate 67 bpm, 170 ms PR interval, QRS 76 ms, QTC 420 ms, axis 41 degrees, no acute changes.  No prior EKG.     Follow-up with nurse to do an EKG baseline.  She plans to come in today

## 2020-04-12 NOTE — Patient Instructions (Signed)
RESOURCES in [place], [place]  If you are experiencing a mental health crisis or an emergency, please call 911 or go to the nearest emergency department.  [facility]   [phone] [facility]  [phone] [facility]   [phone]  Suicide Hotline 1-800-Suicide (404)350-8177)  National Suicide Prevention Lifeline (606)610-5817  (785) 505-0452)  Domestic Violence, Rape/Crisis - [facility] [place] 735-329-9242  [employer] Violence Hotline 1-800-799-SAFE (415)065-3041)  To report Child or Elder Abuse, please call: [facility] Police Department  [phone] [facility] Department  [phone]  [facility] Crisis Line (854)127-0599  Teen Crisis line (323) 202-2832 or (817) [phone]       Counseling Services (NON- psychiatrist offices) Dr. Nicole Cella, [place] [phone] [place], [place] [address]   [facility] [address] [place], [place] [address] [phone]   Lenise Arena. Charlyne Mom, therapist [phone] [address] [place], [place] [address]   [facility] [phone] [address] [place], [place] [address]   Glade Lloyd, therapist [phone] [address], [place], [place] [address]   [facility] [phone] [address] [place], [place], [place] [address]

## 2020-04-13 ENCOUNTER — Encounter: Payer: Self-pay | Admitting: Family Medicine

## 2020-04-28 ENCOUNTER — Telehealth: Payer: Commercial Managed Care - PPO | Admitting: Family Medicine

## 2020-04-28 ENCOUNTER — Other Ambulatory Visit: Payer: Self-pay

## 2020-04-28 ENCOUNTER — Encounter: Payer: Self-pay | Admitting: Family Medicine

## 2020-04-28 NOTE — Progress Notes (Signed)
   Subjective:    She did not answer.    Objective:   Physical Exam        Assessment & Plan:

## 2020-05-09 ENCOUNTER — Other Ambulatory Visit: Payer: Self-pay

## 2020-05-09 ENCOUNTER — Ambulatory Visit (INDEPENDENT_AMBULATORY_CARE_PROVIDER_SITE_OTHER): Payer: Commercial Managed Care - PPO | Admitting: Family Medicine

## 2020-05-09 ENCOUNTER — Encounter: Payer: Self-pay | Admitting: Family Medicine

## 2020-05-09 VITALS — BP 120/70 | HR 71 | Wt 206.0 lb

## 2020-05-09 DIAGNOSIS — R0602 Shortness of breath: Secondary | ICD-10-CM | POA: Diagnosis not present

## 2020-05-09 DIAGNOSIS — F410 Panic disorder [episodic paroxysmal anxiety] without agoraphobia: Secondary | ICD-10-CM

## 2020-05-09 DIAGNOSIS — R079 Chest pain, unspecified: Secondary | ICD-10-CM

## 2020-05-09 DIAGNOSIS — F419 Anxiety disorder, unspecified: Secondary | ICD-10-CM

## 2020-05-09 MED ORDER — ALPRAZOLAM 0.25 MG PO TABS: 0.2500 mg | ORAL_TABLET | Freq: Two times a day (BID) | ORAL | 0 refills | Status: DC | PRN

## 2020-05-09 MED ORDER — ESCITALOPRAM OXALATE 10 MG PO TABS: 10.0000 mg | ORAL_TABLET | Freq: Every day | ORAL | 1 refills | Status: DC

## 2020-05-09 NOTE — Progress Notes (Signed)
Subjective:    Patient ID: [name], female    DOB: Feb 07, 1986, 34 y.o.   MRN: [id number]  HPI Chief Complaint  Patient presents with   follow-up    follow-up on anxiety. getting bad again. had aniexty attack this morning. had 3-4 epsiodes over the weekend. had visit with shane and got better and seen counselor but couldn't afford    She is here with complaints of anxiety and panic attacks for the past 2 years. Denies any specific triggers or events.  She saw my colleague, Dorothea Ogle, few weeks ago for this and she was started on propanolol which did help temporarily.  States she was only given 14 days of this.  States she would like to be on medication.  States she has not been able to see a counselor because she cannot afford it.  States she has been waking up in the middle of the night and having shortness of breath. Last episode was this morning when she was getting ready for work.  This is not new and she also discussed this with my colleague.  She had an EKG which was unremarkable.  Denies history of OSA.  States this morning her chest was tight, she felt short of breath, her thoughts were racing, and she could not focus.  States this lasted for 10 minutes. She did some deep breathing, prayed and talked her self down and she was ok after that.   This has been happening more frequently.   Denies fever, chills, dizziness,  abdominal pain, N/V/D, urinary symptoms, LE edema or leg pain.  No hx of DVT  States she has been drinking alcohol on the weekends to help deal with her anxiety. No drugs.    LMP: Apr 10 2020.  Sexually active. No protection   States she plans to see [facility] to discuss overall health and weight.   Reviewed allergies, medications, past medical, surgical, family, and social history.    Review of Systems Pertinent positives and negatives in the history of present illness.     Objective:   Physical Exam BP 120/70    Pulse 71    Wt 206 lb (93.4  kg)    BMI 35.36 kg/m   Alert and in no distress.  Neck is supple without adenopathy or thyromegaly. Cardiac exam shows a regular sinus rhythm without murmurs or gallops. Lungs are clear to auscultation.     Assessment & Plan:  Anxiety - Plan: CBC with Differential/Platelet, Comprehensive metabolic panel, DG Chest 2 View, escitalopram (LEXAPRO) 10 MG tablet  Panic attacks - Plan: CBC with Differential/Platelet, Comprehensive metabolic panel, TSH, T4, free, escitalopram (LEXAPRO) 10 MG tablet, ALPRAZolam (XANAX) 0.25 MG tablet  Intermittent chest pain - Plan: CBC with Differential/Platelet, Comprehensive metabolic panel, TSH, T4, free, DG Chest 2 View  Shortness of breath - Plan: CBC with Differential/Platelet, Comprehensive metabolic panel, TSH, T4, free, DG Chest 2 View  Her symptoms are consistent with with anxiety and panic attacks. Recommend counseling and I will give her a list of counselors to contact. She is planning to see [facility] for weight concerns and I recommend that she spend her money to see a Social worker. She would like to start on medication so we discussed options. We will try Lexapro and she will start on 1/2 tablet for the first week.  Discussed possible side effects. May use alprazolam as needed. Discussed this medication is not meant for long term treatment.  I will check labs and  send her for a chest XR due to intermittent chest tightness and shortness of breath however this appears to be anxiety provoked.  Consider testing for sleep apnea at follow up in 2 weeks.

## 2020-05-09 NOTE — Patient Instructions (Signed)
Start taking 1/2 tablet of the Lexapro for the first week.  As long as you are doing well, you may increase to the full tablet at the beginning of week 2.  You may experience a dull headache, mild nausea or even worsening anxiety the first week.  You may take the alprazolam for panic attacks.  Avoid alcohol with these medications.  If you were to have any worrisome thoughts, let me know right away.  Call and schedule with a therapist as discussed.  Please go to [facility] imaging for your chest x-ray.  I will see you back in 2 weeks or sooner if needed.  You can call to schedule your appointment with the counselor. A few offices are listed below for you to call.    [facility] [address] [place], [place] [address]  Phone: [phone]  [facility]  [address], [place], [place] [address]  Phone: [phone]  [facility] [address] [place], [place] [address] [phone] for appointments   [facility]  [address] [place], [place] [address] [phone]  [facility]  [address]  (across from [facility])  [phone]    [facility] [address] [place], [place] [address] [phone]

## 2020-05-10 ENCOUNTER — Other Ambulatory Visit: Payer: Commercial Managed Care - PPO

## 2020-05-10 ENCOUNTER — Encounter: Payer: Self-pay | Admitting: Family Medicine

## 2020-05-10 ENCOUNTER — Other Ambulatory Visit: Payer: Self-pay | Admitting: Internal Medicine

## 2020-05-10 DIAGNOSIS — D696 Thrombocytopenia, unspecified: Secondary | ICD-10-CM

## 2020-05-10 HISTORY — DX: Thrombocytopenia, unspecified: D69.6

## 2020-05-10 LAB — CBC WITH DIFFERENTIAL/PLATELET
Basophils Absolute: 0 10*3/uL (ref 0.0–0.2)
Basophils Absolute: 0 10*3/uL (ref 0.0–0.2)
Basos: 1 %
Basos: 1 %
EOS (ABSOLUTE): 0.1 10*3/uL (ref 0.0–0.4)
EOS (ABSOLUTE): 0.1 10*3/uL (ref 0.0–0.4)
Eos: 1 %
Eos: 1 %
Hematocrit: 41.2 % (ref 34.0–46.6)
Hematocrit: 38.6 % (ref 34.0–46.6)
Hemoglobin: 13.5 g/dL (ref 11.1–15.9)
Hemoglobin: 13 g/dL (ref 11.1–15.9)
Immature Grans (Abs): 0 10*3/uL (ref 0.0–0.1)
Immature Granulocytes: 0 %
Lymphocytes Absolute: 1.7 10*3/uL (ref 0.7–3.1)
Lymphocytes Absolute: 1.8 10*3/uL (ref 0.7–3.1)
Lymphs: 30 %
Lymphs: 24 %
MCH: 30.9 pg (ref 26.6–33.0)
MCH: 30.9 pg (ref 26.6–33.0)
MCHC: 32.8 g/dL (ref 31.5–35.7)
MCHC: 33.7 g/dL (ref 31.5–35.7)
MCV: 94 fL (ref 79–97)
MCV: 92 fL (ref 79–97)
Monocytes Absolute: 0.3 10*3/uL (ref 0.1–0.9)
Monocytes Absolute: 0.5 10*3/uL (ref 0.1–0.9)
Monocytes: 5 %
Monocytes: 7 %
Neutrophils Absolute: 3.5 10*3/uL (ref 1.4–7.0)
Neutrophils Absolute: 4.9 10*3/uL (ref 1.4–7.0)
Neutrophils: 63 %
Neutrophils: 67 %
Platelets: 69 10*3/uL — CL (ref 150–450)
Platelets: 120 10*3/uL — ABNORMAL LOW (ref 150–450)
RBC: 4.37 x10E6/uL (ref 3.77–5.28)
RBC: 4.21 x10E6/uL (ref 3.77–5.28)
RDW: 11.6 % — ABNORMAL LOW (ref 11.7–15.4)
RDW: 13.1 % (ref 11.7–15.4)
WBC: 5.6 10*3/uL (ref 3.4–10.8)
WBC: 7.3 10*3/uL (ref 3.4–10.8)

## 2020-05-10 LAB — COMPREHENSIVE METABOLIC PANEL
ALT: 13 IU/L (ref 0–32)
AST: 20 IU/L (ref 0–40)
Albumin/Globulin Ratio: 1.5 (ref 1.2–2.2)
Albumin: 4 g/dL (ref 3.8–4.8)
Alkaline Phosphatase: 67 IU/L (ref 48–121)
BUN/Creatinine Ratio: 14 (ref 9–23)
BUN: 12 mg/dL (ref 6–20)
Bilirubin Total: 0.4 mg/dL (ref 0.0–1.2)
CO2: 23 mmol/L (ref 20–29)
Calcium: 8.9 mg/dL (ref 8.7–10.2)
Chloride: 104 mmol/L (ref 96–106)
Creatinine, Ser: 0.83 mg/dL (ref 0.57–1.00)
GFR calc Af Amer: 106 mL/min/{1.73_m2} (ref >59)
GFR calc non Af Amer: 92 mL/min/{1.73_m2} (ref >59)
Globulin, Total: 2.7 g/dL (ref 1.5–4.5)
Glucose: 123 mg/dL — ABNORMAL HIGH (ref 65–99)
Potassium: 4.1 mmol/L (ref 3.5–5.2)
Sodium: 140 mmol/L (ref 134–144)
Total Protein: 6.7 g/dL (ref 6.0–8.5)

## 2020-05-10 LAB — TSH: TSH: 1.26 u[IU]/mL (ref 0.450–4.500)

## 2020-05-10 LAB — T4, FREE: Free T4: 1.08 ng/dL (ref 0.82–1.77)

## 2020-05-10 NOTE — Progress Notes (Signed)
Please call and let her know that her platelets (the blood cells that help with clotting) are higher than yesterday but they are still low and I would like to refer her to a hematologist for further evaluation. Please put in referral due to thrombocytopenia. Diagnosis is in her problem list.

## 2020-05-10 NOTE — Progress Notes (Signed)
Please call her. She needs a repeat CBC STAT. Her platelets were low but it said there may have been a sample error. Diagnosis is low platelets. Please put in order for this.

## 2020-05-11 ENCOUNTER — Other Ambulatory Visit: Payer: Self-pay | Admitting: Internal Medicine

## 2020-05-11 DIAGNOSIS — D696 Thrombocytopenia, unspecified: Secondary | ICD-10-CM

## 2020-05-16 ENCOUNTER — Telehealth: Payer: Self-pay | Admitting: Physician Assistant

## 2020-05-16 NOTE — Telephone Encounter (Signed)
Received a new hem referral from Beather Arbour, NP for thrombocytopenia. Ms. Boffa returned my call and has been  Scheduled to see Cassie on 6/21 at 1:30pm w/labs at 1pm. Pt aware to arrive 15 minutes early.

## 2020-05-20 ENCOUNTER — Other Ambulatory Visit: Payer: Self-pay | Admitting: Medical Oncology

## 2020-05-20 DIAGNOSIS — D696 Thrombocytopenia, unspecified: Secondary | ICD-10-CM

## 2020-05-22 ENCOUNTER — Other Ambulatory Visit: Payer: Self-pay | Admitting: Physician Assistant

## 2020-05-22 DIAGNOSIS — D696 Thrombocytopenia, unspecified: Secondary | ICD-10-CM

## 2020-05-23 ENCOUNTER — Ambulatory Visit (INDEPENDENT_AMBULATORY_CARE_PROVIDER_SITE_OTHER): Payer: Commercial Managed Care - PPO | Admitting: Family Medicine

## 2020-05-23 ENCOUNTER — Other Ambulatory Visit: Payer: Self-pay

## 2020-05-23 ENCOUNTER — Ambulatory Visit: Payer: Commercial Managed Care - PPO | Admitting: Family Medicine

## 2020-05-23 ENCOUNTER — Inpatient Hospital Stay: Payer: Commercial Managed Care - PPO | Attending: Physician Assistant | Admitting: Physician Assistant

## 2020-05-23 ENCOUNTER — Inpatient Hospital Stay: Payer: Commercial Managed Care - PPO

## 2020-05-23 ENCOUNTER — Other Ambulatory Visit: Payer: Self-pay | Admitting: Physician Assistant

## 2020-05-23 VITALS — BP 110/70 | HR 65 | Wt 203.8 lb

## 2020-05-23 DIAGNOSIS — Z7982 Long term (current) use of aspirin: Secondary | ICD-10-CM | POA: Diagnosis not present

## 2020-05-23 DIAGNOSIS — F419 Anxiety disorder, unspecified: Secondary | ICD-10-CM | POA: Diagnosis not present

## 2020-05-23 DIAGNOSIS — F329 Major depressive disorder, single episode, unspecified: Secondary | ICD-10-CM | POA: Diagnosis not present

## 2020-05-23 DIAGNOSIS — Z801 Family history of malignant neoplasm of trachea, bronchus and lung: Secondary | ICD-10-CM | POA: Diagnosis not present

## 2020-05-23 DIAGNOSIS — Z8249 Family history of ischemic heart disease and other diseases of the circulatory system: Secondary | ICD-10-CM | POA: Insufficient documentation

## 2020-05-23 DIAGNOSIS — D696 Thrombocytopenia, unspecified: Secondary | ICD-10-CM | POA: Diagnosis not present

## 2020-05-23 DIAGNOSIS — Z87891 Personal history of nicotine dependence: Secondary | ICD-10-CM | POA: Diagnosis not present

## 2020-05-23 DIAGNOSIS — D691 Qualitative platelet defects: Secondary | ICD-10-CM | POA: Diagnosis not present

## 2020-05-23 DIAGNOSIS — Z79899 Other long term (current) drug therapy: Secondary | ICD-10-CM | POA: Diagnosis not present

## 2020-05-23 LAB — CBC WITH DIFFERENTIAL (CANCER CENTER ONLY)
Abs Immature Granulocytes: 0.01 10*3/uL (ref 0.00–0.07)
Basophils Absolute: 0 10*3/uL (ref 0.0–0.1)
Basophils Relative: 1 %
Eosinophils Absolute: 0.1 10*3/uL (ref 0.0–0.5)
Eosinophils Relative: 2 %
HCT: 40.1 % (ref 36.0–46.0)
Hemoglobin: 12.9 g/dL (ref 12.0–15.0)
Immature Granulocytes: 0 %
Lymphocytes Relative: 33 %
Lymphs Abs: 1.7 10*3/uL (ref 0.7–4.0)
MCH: 31.1 pg (ref 26.0–34.0)
MCHC: 32.2 g/dL (ref 30.0–36.0)
MCV: 96.6 fL (ref 80.0–100.0)
Monocytes Absolute: 0.3 10*3/uL (ref 0.1–1.0)
Monocytes Relative: 6 %
Neutro Abs: 2.8 10*3/uL (ref 1.7–7.7)
Neutrophils Relative %: 58 %
Platelet Count: 238 10*3/uL (ref 150–400)
RBC: 4.15 MIL/uL (ref 3.87–5.11)
RDW: 12.4 % (ref 11.5–15.5)
WBC Count: 5 10*3/uL (ref 4.0–10.5)
nRBC: 0 % (ref 0.0–0.2)

## 2020-05-23 LAB — CMP (CANCER CENTER ONLY)
ALT: 16 U/L (ref 0–44)
AST: 19 U/L (ref 15–41)
Albumin: 3.5 g/dL (ref 3.5–5.0)
Alkaline Phosphatase: 62 U/L (ref 38–126)
Anion gap: 9 (ref 5–15)
BUN: 13 mg/dL (ref 6–20)
CO2: 27 mmol/L (ref 22–32)
Calcium: 8.7 mg/dL — ABNORMAL LOW (ref 8.9–10.3)
Chloride: 107 mmol/L (ref 98–111)
Creatinine: 0.86 mg/dL (ref 0.44–1.00)
GFR, Est AFR Am: >60 mL/min (ref >60)
GFR, Estimated: >60 mL/min (ref >60)
Glucose, Bld: 80 mg/dL (ref 70–99)
Potassium: 4.2 mmol/L (ref 3.5–5.1)
Sodium: 143 mmol/L (ref 135–145)
Total Bilirubin: 0.6 mg/dL (ref 0.3–1.2)
Total Protein: 6.9 g/dL (ref 6.5–8.1)

## 2020-05-23 LAB — LACTATE DEHYDROGENASE: LDH: 210 U/L — ABNORMAL HIGH (ref 98–192)

## 2020-05-23 LAB — PLATELET BY CITRATE

## 2020-05-23 NOTE — Progress Notes (Signed)
[facility] [phone]   Fax:([phone]) [phone]  CONSULT NOTE  REFERRING PHYSICIAN: Hetty Blend NP  REASON FOR CONSULTATION:  Thrombocytopenia  HPI [name] is a 34 y.o. female with a past medical history significant for anxiety, depression, and tendinitis is referred to the clinic for evaluation of thrombocytopenia.  The patient had a visit with her primary care provider on 05/09/2020 for a routine follow-up visit for the chief complaint of anxiety attacks.  The patient had routine blood work performed at this visit which demonstrated an abnormal platelet count of 69,000.  The patient's CBC also noted aggregated platelets.  The patient had a repeat CBC the following day which noted thrombocytopenia with a platelet count of 120 K.  The patient was then subsequently referred to the clinic for further evaluation and management of her condition.  To the patient's knowledge, she denies any history of thrombocytopenia.  She denies any abnormal bleeding or bruising including hematemesis, hemoptysis, melena, hematochezia, or hematuria.  Patient states that "a few times a year" the patient may have mild self-limiting gingival bleeding or mild epistaxis.  The patient states that in 2014 she had surgery. Per chart review, it appears this was a dilation and evacuation. The patient denies any major bleeding complications from her prior surgery.  She has never required a blood transfusion or platelet transfusion.  He denies any new medications.  For over-the-counter medications, she takes a keto pill to lose weight as well as Tylenol 3-4 times a week for headaches.  She also occasionally takes aspirin approximately 2 times per month.  She denies any blood thinners.  She denies any recent viral infections.  She denies any history of autoimmune disorders.  She denies any history of any nutritional deficiencies.  She denies any recent fever, chills, night sweats, lymphadenopathy,  or unexplained weight loss.  She denies any nausea, vomiting, diarrhea, or constipation.  She denies any recent signs and symptoms of infection including a fever, sore throat, cough, skin infections, or dysuria.   The patient's family history consists of a mother who reportedly does not have any medical problems.  The patient's father had cancer; however, patient cannot recall what the primary malignancy was.  Patient works as a Mining engineer.  She is married and has a 65 year old child.  The patient drinks approximately 1 alcoholic beverage per night.  She smokes a few puffs of cigars a few times a week.   HPI  Past Medical History:  Diagnosis Date  . Chlamydia   . Gonorrhea   . Thrombocytopenia (HCC) 05/10/2020    Past Surgical History:  Procedure Laterality Date  . DILATION AND EVACUATION     Pt reports that she has had 6 surgical elective ABs - pt disclosed this to sonographer and stated that she did not want anyone else to know  . DILATION AND EVACUATION N/A 06/04/2013   Procedure: DILATATION AND EVACUATION;  Surgeon: Lavina Hamman, MD;  Location: [facility];  Service: Gynecology;  Laterality: N/A;    Family History  Problem Relation Age of Onset  . Migraines Mother   . Heart disease Father        after chemo   . Cancer Father   . Heart failure Maternal Grandmother   . Cancer Maternal Grandfather        lung  . Heart failure Paternal Grandmother     Social History Social History   Tobacco Use  . Smoking status: Former Smoker  Packs/day: 0.10    Types: Cigarettes  . Smokeless tobacco: Former Systems developer    Quit date: 03/04/2013  Substance Use Topics  . Alcohol use: Yes    Comment: occasional  . Drug use: No    No Known Allergies  Current Outpatient Medications  Medication Sig Dispense Refill  . ALPRAZolam (XANAX) 0.25 MG tablet Take 1 tablet (0.25 mg total) by mouth 2 (two) times daily as needed for anxiety. 10 tablet 0  . escitalopram (LEXAPRO) 10 MG  tablet Take 1 tablet (10 mg total) by mouth daily. 30 tablet 1   No current facility-administered medications for this visit.    Review of Systems REVIEW OF SYSTEMS:   Review of Systems  Constitutional: Positive for fatigue. Negative for appetite change, chills, fever and unexpected weight change.  HENT: Negative for mouth sores, nosebleeds, sore throat and trouble swallowing.   Eyes: Negative for eye problems and icterus.  Respiratory: Negative for cough, hemoptysis, shortness of breath and wheezing.   Cardiovascular: Negative for chest pain and leg swelling.  Gastrointestinal: Negative for abdominal pain, constipation, diarrhea, nausea and vomiting.  Genitourinary: Negative for bladder incontinence, difficulty urinating, dysuria, frequency and hematuria.   Musculoskeletal: Negative for back pain, gait problem, neck pain and neck stiffness.  Skin: Negative for itching and rash.  Neurological: Negative for dizziness, extremity weakness, gait problem, headaches, light-headedness and seizures.  Hematological: Negative for adenopathy. Does not bruise/bleed easily.  Psychiatric/Behavioral: Negative for confusion, depression and sleep disturbance. The patient is not nervous/anxious.     PHYSICAL EXAMINATION:  Blood pressure 135/88, pulse 62, temperature 97.8 F (36.6 C), temperature source Temporal, resp. rate 20, height 5\' 4"  (1.626 m), weight 202 lb 4.8 oz (91.8 kg), SpO2 100 %.  ECOG PERFORMANCE STATUS: 0  Physical Exam  Constitutional: Oriented to person, place, and time and well-developed, well-nourished, and in no distress.  HENT:  Head: Normocephalic and atraumatic.  Mouth/Throat: Oropharynx is clear and moist. No oropharyngeal exudate.  Eyes: Conjunctivae are normal. Right eye exhibits no discharge. Left eye exhibits no discharge. No scleral icterus.  Neck: Normal range of motion. Neck supple.  Cardiovascular: Normal rate, regular rhythm, normal heart sounds and intact distal  pulses.   Pulmonary/Chest: Effort normal and breath sounds normal. No respiratory distress. No wheezes. No rales.  Abdominal: Soft. Bowel sounds are normal. Exhibits no distension and no mass. There is no tenderness.  Musculoskeletal: Normal range of motion. Exhibits no edema.  Lymphadenopathy:    No cervical adenopathy.  Neurological: Alert and oriented to person, place, and time. Exhibits normal muscle tone. Gait normal. Coordination normal.  Skin: Skin is warm and dry. No rash noted. Not diaphoretic. No erythema. No pallor.  Psychiatric: Mood, memory and judgment normal.  Vitals reviewed.   LABORATORY DATA: Lab Results  Component Value Date   WBC 5.0 05/23/2020   HGB 12.9 05/23/2020   HCT 40.1 05/23/2020   MCV 96.6 05/23/2020   PLT 238 05/23/2020      Chemistry      Component Value Date/Time   NA 143 05/23/2020 1206   NA 140 05/09/2020 1227   K 4.2 05/23/2020 1206   CL 107 05/23/2020 1206   CO2 27 05/23/2020 1206   BUN 13 05/23/2020 1206   BUN 12 05/09/2020 1227   CREATININE 0.86 05/23/2020 1206      Component Value Date/Time   CALCIUM 8.7 (L) 05/23/2020 1206   ALKPHOS 62 05/23/2020 1206   AST 19 05/23/2020 1206   ALT 16 05/23/2020  1206   BILITOT 0.6 05/23/2020 1206       RADIOGRAPHIC STUDIES: No results found.  ASSESSMENT: This is a very pleasant 34 year old African-American female referred to the clinic for thrombocytopenia.   PLAN: The patient was seen with Dr. Arbutus Ped today.  Patient had a repeat CBC which was collected in citrate today due to the patient's history of platelet aggregation.  The patient's platelet count was within normal limits at 238K.  The patient's CMP was unremarkable.  The patient likely does not have true thrombocytopenia, and her prior abnormal lab results were likely secondary to platelet aggregation.  No further work-up is necessary.   We will not schedule any follow-up appointments at this time, but would be happy to see the  patient in the future if she ever develops any new or worsening concerns with her platelets in the future.  The patient voices understanding of current disease status and treatment options and is in agreement with the current care plan.  All questions were answered. The patient knows to call the clinic with any problems, questions or concerns. We can certainly see the patient much sooner if necessary.  Thank you so much for allowing me to participate in the care of [name]. I will continue to follow up the patient with you and assist in her care.  The total time spent in the appointment was 60 minutes.  Disclaimer: This note was dictated with voice recognition software. Similar sounding words can inadvertently be transcribed and may not be corrected upon review.   Kutler Vanvranken L Josejuan Hoaglin May 23, 2020, 1:34 PM  ADDENDUM: Hematology/Oncology Attending: I had a face-to-face encounter with the patient today.  I recommended her care plan.  This is a very pleasant 34 years old African-American female who was seen by her primary care physician for routine evaluation and CBC showed low platelets count of 69,000 with clumping.  Repeat platelet count few days later showed improvement of the count up to 120,000.  The patient was referred to [place] today for further evaluation and recommendation regarding her condition. The patient is feeling fine with no concerning complaints. She had repeat CBC today and platelets count on citrate that showed complete resolution of her problem and she has normal platelets count. I explained to the patient that this could be secondary to an autoimmune disorder or artifacts from the EDTA in the collection tube. I recommended for the patient to continue on observation with routine follow-up visit with her primary care physician. She was advised to call immediately if she has any other concerning issues. Disclaimer: This note was dictated with voice recognition  software. Similar sounding words can inadvertently be transcribed and may be missed upon review. Lajuana Matte, MD 05/23/20

## 2020-05-23 NOTE — Progress Notes (Signed)
   Subjective:    Patient ID: [name], female    DOB: 1986/02/12, 34 y.o.   MRN: [id number]  HPI Chief Complaint  Patient presents with  . 2 week follow-up    2 week follow-up on aniexty. taking xanax 1/2 tablet to help   Here for a 2 week follow up on anxiety and staring on Lexapro. States her anxiety has improved. States she is feeling depressed.  Reports having a mild headache but no other side effects.  States she has been taking 1/2 alprazolam as needed.  States she has a counseling visit with next Thursday at [facility].   No fever, chills, dizziness, chest pain, palpitations, abdominal pain, N/V/D.    Review of Systems Pertinent positives and negatives in the history of present illness.     Objective:   Physical Exam BP 110/70   Pulse 65   Wt 203 lb 12.8 oz (92.4 kg)   BMI 34.98 kg/m   Alert and oriented and in no acute distress. Normal mood and thought process.       Assessment & Plan:  Anxiety  Doing well on Lexapro. She has noticed improvement in anxiety. Will continue medication. Use alprazolam sparingly. See her counselor as scheduled. Follow up in 3 months.

## 2020-05-24 ENCOUNTER — Ambulatory Visit: Payer: Commercial Managed Care - PPO | Admitting: Family Medicine

## 2020-05-26 ENCOUNTER — Ambulatory Visit: Payer: Commercial Managed Care - PPO | Admitting: Family Medicine

## 2020-05-27 ENCOUNTER — Telehealth: Payer: Self-pay

## 2020-05-27 NOTE — Telephone Encounter (Signed)
Pt. Called asking for a refill on her Alprazolam and I let her know I would give you the message but you were out of the office today and would be back Monday. Pt. Last apt was 05/23/20.

## 2020-05-29 ENCOUNTER — Other Ambulatory Visit: Payer: Self-pay | Admitting: Family Medicine

## 2020-05-29 DIAGNOSIS — F410 Panic disorder [episodic paroxysmal anxiety] without agoraphobia: Secondary | ICD-10-CM

## 2020-05-29 MED ORDER — ALPRAZOLAM 0.25 MG PO TABS: 0.2500 mg | ORAL_TABLET | Freq: Two times a day (BID) | ORAL | 0 refills | Status: DC | PRN

## 2020-05-29 NOTE — Telephone Encounter (Signed)
Please let her know that I refilled her alprazolam. The SSRI should help her with her anxiety and I also recommend counseling.

## 2020-05-30 NOTE — Telephone Encounter (Signed)
Left message for pt to call me back 

## 2020-06-01 NOTE — Telephone Encounter (Signed)
I have left multiple calls for pt

## 2020-06-16 ENCOUNTER — Other Ambulatory Visit: Payer: Self-pay | Admitting: Family Medicine

## 2020-06-16 DIAGNOSIS — F410 Panic disorder [episodic paroxysmal anxiety] without agoraphobia: Secondary | ICD-10-CM

## 2020-06-16 MED ORDER — ALPRAZOLAM 0.25 MG PO TABS: 0.2500 mg | ORAL_TABLET | Freq: Two times a day (BID) | ORAL | 0 refills | Status: DC | PRN

## 2020-06-16 NOTE — Telephone Encounter (Signed)
Please see how she is doing if she has started counseling.  I will give her a refill on the alprazolam and our goal is that with the daily Lexapro and counseling that she will eventually not need the alprazolam

## 2020-06-16 NOTE — Telephone Encounter (Signed)
Left message for pt to call back  °

## 2020-07-04 ENCOUNTER — Other Ambulatory Visit: Payer: Self-pay | Admitting: Family Medicine

## 2020-07-04 DIAGNOSIS — F410 Panic disorder [episodic paroxysmal anxiety] without agoraphobia: Secondary | ICD-10-CM

## 2020-07-04 DIAGNOSIS — F419 Anxiety disorder, unspecified: Secondary | ICD-10-CM

## 2020-07-07 ENCOUNTER — Other Ambulatory Visit: Payer: Self-pay | Admitting: Family Medicine

## 2020-07-07 DIAGNOSIS — F410 Panic disorder [episodic paroxysmal anxiety] without agoraphobia: Secondary | ICD-10-CM

## 2020-07-07 DIAGNOSIS — F419 Anxiety disorder, unspecified: Secondary | ICD-10-CM

## 2020-07-07 MED ORDER — ALPRAZOLAM 0.25 MG PO TABS: 0.2500 mg | ORAL_TABLET | Freq: Two times a day (BID) | ORAL | 0 refills | Status: DC | PRN

## 2020-07-07 NOTE — Telephone Encounter (Signed)
Is this okay to refill? 

## 2020-07-08 NOTE — Telephone Encounter (Signed)
This was done yesterday please deny

## 2020-07-08 NOTE — Telephone Encounter (Signed)
Please deny. This was done yesterday

## 2020-07-25 ENCOUNTER — Other Ambulatory Visit: Payer: Self-pay | Admitting: Family Medicine

## 2020-07-25 DIAGNOSIS — F410 Panic disorder [episodic paroxysmal anxiety] without agoraphobia: Secondary | ICD-10-CM

## 2020-07-25 NOTE — Telephone Encounter (Signed)
She has a visit with me in 2 days. Let her know that we will need to talk about how often she is taking alprazolam and discuss her anxiety further at that visit.

## 2020-07-25 NOTE — Telephone Encounter (Signed)
Is this okay to refill? 

## 2020-07-25 NOTE — Telephone Encounter (Signed)
Left message for pt to call me back 

## 2020-07-27 ENCOUNTER — Encounter: Payer: Self-pay | Admitting: Family Medicine

## 2020-07-27 ENCOUNTER — Other Ambulatory Visit: Payer: Self-pay

## 2020-07-27 ENCOUNTER — Ambulatory Visit (INDEPENDENT_AMBULATORY_CARE_PROVIDER_SITE_OTHER): Payer: Commercial Managed Care - PPO | Admitting: Family Medicine

## 2020-07-27 VITALS — BP 120/70 | HR 66 | Wt 202.2 lb

## 2020-07-27 DIAGNOSIS — M79672 Pain in left foot: Secondary | ICD-10-CM

## 2020-07-27 DIAGNOSIS — M722 Plantar fascial fibromatosis: Secondary | ICD-10-CM | POA: Diagnosis not present

## 2020-07-27 DIAGNOSIS — F419 Anxiety disorder, unspecified: Secondary | ICD-10-CM

## 2020-07-27 DIAGNOSIS — M79671 Pain in right foot: Secondary | ICD-10-CM | POA: Diagnosis not present

## 2020-07-27 DIAGNOSIS — F410 Panic disorder [episodic paroxysmal anxiety] without agoraphobia: Secondary | ICD-10-CM

## 2020-07-27 MED ORDER — ESCITALOPRAM OXALATE 20 MG PO TABS: 20.0000 mg | ORAL_TABLET | Freq: Every day | ORAL | 2 refills | Status: DC

## 2020-07-27 MED ORDER — ALPRAZOLAM 0.25 MG PO TABS: 0.2500 mg | ORAL_TABLET | Freq: Two times a day (BID) | ORAL | 0 refills | Status: DC | PRN

## 2020-07-27 NOTE — Progress Notes (Signed)
   Subjective:    Patient ID: [name], female    DOB: [birth year], 34 y.o.   MRN: [id number]  HPI Chief Complaint  Patient presents with  . foot- swelling    left foot swelling- both heels hurting. seen shane back in march. startes in the middle off foot and goes up back of foot to calf.   Marland Kitchen anxiety    anxiety- 3 weeks ago- at nail salon- sweating, having trouble breathing, heartrate went up, went to bathroom and laid down on floor, happen again 3 hours later where she was at a store. was out of her xanax. not seeing couseling   Anxiety- states 3 weeks ago she had a panic attack. States her heart was racing, she was breathing fast, sweating and had to lay down in the floor to calm down. States she had another episode a few hours later.  She has been taking alprazolam fairly regularly since June 2021.   Reports taking Lexapro most days but does miss a day every now and then.   She is not seeing a Veterinary surgeon. Did not establish with one.   States her issue with anxiety started 2 years ago. No known trigger.   She also complains of bilateral foot pain, worse on left. Pain is worse first in the morning when she is walking barefoot. Pain is in her heel and on the plantar surface of her foot and occasionally radiates up her posterior heel.  She wears flat shoes. No known injury.  No numbness, tingling or weakness.   No fever, chills, N/V  Reviewed allergies, medications, past medical, surgical, family, and social history.    Review of Systems Pertinent positives and negatives in the history of present illness.     Objective:   Physical Exam BP 120/70   Pulse 66   Wt 202 lb 3.2 oz (91.7 kg)   BMI 34.71 kg/m   Bilateral feet with TTP of her heels and along the plantar fascia. No erythema, edema or nail deformity. Normal color, sensation, ROM and strength.       Assessment & Plan:  Anxiety - Plan: escitalopram (LEXAPRO) 20 MG tablet, ALPRAZolam (XANAX) 0.25 MG  tablet -Reports minimal improvement in anxiety and panic.  We will increase her Lexapro and I do recommend that she take this daily.  Strongly encouraged her to schedule with a therapist.  I will provide her with a list of offices.  I will refill alprazolam for her but we discussed that this medication is not meant for long-term use.  Discussed that I do not plan on refilling this for her.  Panic attacks - Plan: escitalopram (LEXAPRO) 20 MG tablet, ALPRAZolam (XANAX) 0.25 MG tablet -Increase Lexapro and refill alprazolam for her to use sparingly.  Discussed long-term potential side effects of this medication.  Recommend counseling  Foot pain, bilateral  Plantar fasciitis -Discussed conservative treatment including ice massage, stretching, anti-inflammatories, and wearing supportive shoes.  Recommend that she avoid flip-flops, flat sandals and walking barefoot for the next few weeks.

## 2020-07-27 NOTE — Patient Instructions (Signed)
I sent in a new prescription for your Lexapro and increase the dose.  Hopefully this will help. I also refilled your alprazolam but hopefully we will not need to continue on this medication. I recommend scheduling with a therapist  For your foot pain, I recommend doing an ice massage as we discussed, stretching by pulling your toes upward towards your head and wearing good supportive shoes.  You may also take an anti-inflammatory for the next week or 2.  You can call to schedule your appointment with the counselor. A few offices are listed below for you to call.    [facility] [address] [place], [place] [address]  Phone: [phone]  [facility]  [address], [place], [place] [address]  Phone: [phone]  [facility] [address] [place], [place] [address] [phone] for appointments   [facility]  [address] [place], [place] [address] [phone]  [facility]  [address]  (across from [facility])  [phone]    [facility] [address] [place], [place] [address] [phone]         Plantar Fasciitis  Plantar fasciitis is a painful foot condition that affects the heel. It occurs when the band of tissue that connects the toes to the heel bone (plantar fascia) becomes irritated. This can happen as the result of exercising too much or doing other repetitive activities (overuse injury). The pain from plantar fasciitis can range from mild irritation to severe pain that makes it difficult to walk or move. The pain is usually worse in the morning after sleeping, or after sitting or lying down for a while. Pain may also be worse after long periods of walking or standing. What are the causes? This condition may be caused by:  Standing for long periods of time.  Wearing shoes that  do not have good arch support.  Doing activities that put stress on joints (high-impact activities), including running, aerobics, and ballet.  Being overweight.  An abnormal way of walking (gait).  Tight muscles in the back of your lower leg (calf).  High arches in your feet.  Starting a new athletic activity. What are the signs or symptoms? The main symptom of this condition is heel pain. Pain may:  Be worse with first steps after a time of rest, especially in the morning after sleeping or after you have been sitting or lying down for a while.  Be worse after long periods of standing still.  Decrease after 30-45 minutes of activity, such as gentle walking. How is this diagnosed? This condition may be diagnosed based on your medical history and your symptoms. Your health care provider may ask questions about your activity level. Your health care provider will do a physical exam to check for:  A tender area on the bottom of your foot.  A high arch in your foot.  Pain when you move your foot.  Difficulty moving your foot. You may have imaging tests to confirm the diagnosis, such as:  X-rays.  Ultrasound.  MRI. How is this treated? Treatment for plantar fasciitis depends on how severe your condition is. Treatment may include:  Rest, ice, applying pressure (compression), and raising the affected foot (elevation). This may be called RICE therapy. Your health care provider may recommend RICE therapy along with over-the-counter pain medicines to manage your pain.  Exercises  to stretch your calves and your plantar fascia.  A splint that holds your foot in a stretched, upward position while you sleep (night splint).  Physical therapy to relieve symptoms and prevent problems in the future.  Injections of steroid medicine (cortisone) to relieve pain and inflammation.  Stimulating your plantar fascia with electrical impulses (extracorporeal shock wave therapy). This is usually  the last treatment option before surgery.  Surgery, if other treatments have not worked after 12 months. Follow these instructions at home:  Managing pain, stiffness, and swelling  If directed, put ice on the painful area: ? Put ice in a plastic bag, or use a frozen bottle of water. ? Place a towel between your skin and the bag or bottle. ? Roll the bottom of your foot over the bag or bottle. ? Do this for 20 minutes, 2-3 times a day.  Wear athletic shoes that have air-sole or gel-sole cushions, or try wearing soft shoe inserts that are designed for plantar fasciitis.  Raise (elevate) your foot above the level of your heart while you are sitting or lying down. Activity  Avoid activities that cause pain. Ask your health care provider what activities are safe for you.  Do physical therapy exercises and stretches as told by your health care provider.  Try activities and forms of exercise that are easier on your joints (low-impact). Examples include swimming, water aerobics, and biking. General instructions  Take over-the-counter and prescription medicines only as told by your health care provider.  Wear a night splint while sleeping, if told by your health care provider. Loosen the splint if your toes tingle, become numb, or turn cold and blue.  Maintain a healthy weight, or work with your health care provider to lose weight as needed.  Keep all follow-up visits as told by your health care provider. This is important. Contact a health care provider if you:  Have symptoms that do not go away after caring for yourself at home.  Have pain that gets worse.  Have pain that affects your ability to move or do your daily activities. Summary  Plantar fasciitis is a painful foot condition that affects the heel. It occurs when the band of tissue that connects the toes to the heel bone (plantar fascia) becomes irritated.  The main symptom of this condition is heel pain that may be worse  after exercising too much or standing still for a long time.  Treatment varies, but it usually starts with rest, ice, compression, and elevation (RICE therapy) and over-the-counter medicines to manage pain. This information is not intended to replace advice given to you by your health care provider. Make sure you discuss any questions you have with your health care provider. Document Revised: 11/01/2017 Document Reviewed: 09/16/2017 Elsevier Patient Education  2020 [employer].

## 2020-08-03 ENCOUNTER — Other Ambulatory Visit: Payer: Self-pay | Admitting: Family Medicine

## 2020-08-03 DIAGNOSIS — F410 Panic disorder [episodic paroxysmal anxiety] without agoraphobia: Secondary | ICD-10-CM

## 2020-08-03 DIAGNOSIS — F419 Anxiety disorder, unspecified: Secondary | ICD-10-CM

## 2020-09-21 ENCOUNTER — Telehealth: Payer: Self-pay | Admitting: Family Medicine

## 2020-09-21 NOTE — Telephone Encounter (Signed)
Dismissal letter in guarantor snapshot  °

## 2020-10-27 ENCOUNTER — Other Ambulatory Visit: Payer: Self-pay | Admitting: Family Medicine

## 2020-10-27 DIAGNOSIS — F410 Panic disorder [episodic paroxysmal anxiety] without agoraphobia: Secondary | ICD-10-CM

## 2020-10-27 DIAGNOSIS — F419 Anxiety disorder, unspecified: Secondary | ICD-10-CM

## 2020-11-06 ENCOUNTER — Other Ambulatory Visit: Payer: Self-pay | Admitting: Family Medicine

## 2020-11-06 DIAGNOSIS — F410 Panic disorder [episodic paroxysmal anxiety] without agoraphobia: Secondary | ICD-10-CM

## 2020-11-06 DIAGNOSIS — F419 Anxiety disorder, unspecified: Secondary | ICD-10-CM

## 2020-11-08 ENCOUNTER — Other Ambulatory Visit: Payer: Self-pay | Admitting: Family Medicine

## 2020-11-08 DIAGNOSIS — F410 Panic disorder [episodic paroxysmal anxiety] without agoraphobia: Secondary | ICD-10-CM

## 2020-11-08 DIAGNOSIS — F419 Anxiety disorder, unspecified: Secondary | ICD-10-CM

## 2020-11-09 NOTE — Telephone Encounter (Signed)
Please deny as pt has been dismissed

## 2021-06-24 ENCOUNTER — Other Ambulatory Visit: Payer: Self-pay | Admitting: Family Medicine

## 2021-06-24 DIAGNOSIS — F410 Panic disorder [episodic paroxysmal anxiety] without agoraphobia: Secondary | ICD-10-CM

## 2021-06-24 DIAGNOSIS — F419 Anxiety disorder, unspecified: Secondary | ICD-10-CM

## 2021-06-26 MED ORDER — ALPRAZOLAM 0.25 MG PO TABS: 0.2500 mg | ORAL_TABLET | Freq: Two times a day (BID) | ORAL | 0 refills | Status: DC | PRN

## 2023-05-27 ENCOUNTER — Other Ambulatory Visit: Payer: Self-pay

## 2023-05-27 DIAGNOSIS — J101 Influenza due to other identified influenza virus with other respiratory manifestations: Secondary | ICD-10-CM | POA: Diagnosis not present

## 2023-05-27 DIAGNOSIS — Z1152 Encounter for screening for COVID-19: Secondary | ICD-10-CM | POA: Insufficient documentation

## 2023-05-27 DIAGNOSIS — R059 Cough, unspecified: Secondary | ICD-10-CM | POA: Diagnosis present

## 2023-05-27 DIAGNOSIS — Z5321 Procedure and treatment not carried out due to patient leaving prior to being seen by health care provider: Secondary | ICD-10-CM | POA: Insufficient documentation

## 2023-05-27 LAB — RESP PANEL BY RT-PCR (RSV, FLU A&B, COVID)  RVPGX2
Influenza A by PCR: POSITIVE — AB
Influenza B by PCR: NEGATIVE
Resp Syncytial Virus by PCR: NEGATIVE
SARS Coronavirus 2 by RT PCR: NEGATIVE

## 2023-05-27 LAB — GROUP A STREP BY PCR: Group A Strep by PCR: NOT DETECTED

## 2023-05-27 NOTE — ED Triage Notes (Signed)
Patient here POV from Home.  Endorses Cough, headache, Congestion, Body Aches for 2.5 Days.   No Fevers. Mostly Dry. Sore Throat as well that has improved.   NAD Noted during Triage. A&Ox4. GCS 15. Ambulatory.

## 2023-05-28 ENCOUNTER — Emergency Department (HOSPITAL_BASED_OUTPATIENT_CLINIC_OR_DEPARTMENT_OTHER)
Admission: EM | Admit: 2023-05-28 | Discharge: 2023-05-28 | Discharge status: Against Medical Advice | Payer: Commercial Managed Care - PPO | Attending: Emergency Medicine | Admitting: Emergency Medicine

## 2023-05-28 ENCOUNTER — Other Ambulatory Visit: Payer: Self-pay

## 2023-05-28 ENCOUNTER — Emergency Department (HOSPITAL_COMMUNITY)
Admission: EM | Admit: 2023-05-28 | Discharge: 2023-05-28 | Discharge status: Against Medical Advice | Payer: Commercial Managed Care - PPO | Source: Home / Self Care

## 2023-05-28 ENCOUNTER — Ambulatory Visit (HOSPITAL_COMMUNITY)
Admission: EM | Admit: 2023-05-28 | Discharge: 2023-05-28 | Disposition: A | Discharge status: Home / Self Care | Payer: Commercial Managed Care - PPO | Attending: Emergency Medicine | Admitting: Emergency Medicine

## 2023-05-28 ENCOUNTER — Encounter (HOSPITAL_COMMUNITY): Payer: Self-pay | Admitting: *Deleted

## 2023-05-28 DIAGNOSIS — J069 Acute upper respiratory infection, unspecified: Secondary | ICD-10-CM | POA: Insufficient documentation

## 2023-05-28 DIAGNOSIS — J101 Influenza due to other identified influenza virus with other respiratory manifestations: Secondary | ICD-10-CM

## 2023-05-28 DIAGNOSIS — Z5321 Procedure and treatment not carried out due to patient leaving prior to being seen by health care provider: Secondary | ICD-10-CM | POA: Insufficient documentation

## 2023-05-28 MED ORDER — FLUTICASONE PROPIONATE 50 MCG/ACT NA SUSP: 2.0000 | Freq: Every day | NASAL | 2 refills | Status: AC

## 2023-05-28 MED ORDER — ONDANSETRON 4 MG PO TBDP: 4.0000 mg | ORAL_TABLET | Freq: Four times a day (QID) | ORAL | 0 refills | Status: AC | PRN

## 2023-05-28 MED ORDER — GUAIFENESIN ER 600 MG PO TB12: 600.0000 mg | ORAL_TABLET | Freq: Two times a day (BID) | ORAL | 0 refills | Status: AC

## 2023-05-28 NOTE — Discharge Instructions (Addendum)
I recommend mucinex twice daily with LOTS OF FLUIDS! There is a goodRx coupon attached for the mucinex  I recommend once daily nasal spray to reduce congestion and pressure behind the ears  If you need to use the nausea medicine, it can be taken every 6 hours.  Use ibuprofen or tylenol for headache, body aches, and fever

## 2023-05-28 NOTE — ED Triage Notes (Signed)
Patient reports cough, headache, body aches, congestion for the past few days. Was seen at [facility], tested positive for flu A.

## 2023-05-28 NOTE — ED Triage Notes (Signed)
Pt here today because she left [facility] after knowing she tested positive for FLU last night.Pt reports it was to busy and did not see a Provider.

## 2023-05-28 NOTE — ED Provider Notes (Signed)
[facility]    CSN: [id number] Arrival date & time: 05/28/23  0827      History   Chief Complaint Chief Complaint  Patient presents with   Influenza    HPI [name] is a 37 y.o. female.  Here with 3 day history of body aches, headache, nasal congestion and dry cough. Fever of 103 on first day of symptoms. A little nausea yesterday, no vomiting. Tried dayquil/nyquil and theraflu, liquid IV Has not had any fluids today  Seen in the ED last night, tested positive for influenza A. Negative strep and covid. 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   Thrombocytopenia (HCC) 05/10/2020   Chest pain 04/12/2020   Anxiety 04/12/2020   Sleep disturbances 04/12/2020   Acute stress reaction 04/12/2020   Family history of heart disease 04/12/2020   Achilles tendinitis of left lower extremity 02/15/2020   Acute left ankle pain 02/15/2020   Left ankle swelling 02/15/2020   Left ankle tendonitis 02/15/2020    Past Surgical History:  Procedure Laterality Date   DILATION AND EVACUATION     Pt reports that she has had 6 surgical elective ABs - pt disclosed this to sonographer and stated that she did not want anyone else to know   DILATION AND EVACUATION N/A 06/04/2013   Procedure: DILATATION AND EVACUATION;  Surgeon: Lavina Hamman, MD;  Location: [facility];  Service: Gynecology;  Laterality: N/A;    OB History     Gravida  8   Para  2   Term  1   Preterm  1   AB  6   Living  2      SAB      IAB  6   Ectopic      Multiple      Live Births  2            Home Medications    Prior to Admission medications   Medication Sig Start Date  End Date Taking? Authorizing Provider  fluticasone (FLONASE) 50 MCG/ACT nasal spray Place 2 sprays into both nostrils daily. 05/28/23  Yes Constanza Mincy, Lurena Joiner, PA-C  guaiFENesin (MUCINEX) 600 MG 12 hr tablet Take 1 tablet (600 mg total) by mouth 2 (two) times daily for 5 days. 05/28/23 06/02/23 Yes Tunis Gentle, Lurena Joiner, PA-C  ondansetron (ZOFRAN-ODT) 4 MG disintegrating tablet Take 1 tablet (4 mg total) by mouth every 6 (six) hours as needed for nausea or vomiting. 05/28/23  Yes Tisha Cline, Lurena Joiner, PA-C    Family History Family History  Problem Relation Age of Onset   Migraines Mother    Heart disease Father        after chemo    Cancer Father    Heart failure Maternal Grandmother    Cancer Maternal Grandfather        lung   Heart failure Paternal Grandmother     Social History Social History   Tobacco Use   Smoking status: Former    Packs/day: .1    Types: Cigarettes   Smokeless tobacco: Former    Quit date: 03/04/2013  Substance Use Topics   Alcohol use: Yes    Comment:  occasional   Drug use: No     Allergies   Patient has no known allergies.   Review of Systems Review of Systems Per HPI  Physical Exam Triage Vital Signs ED Triage Vitals  Enc Vitals Group     BP 05/28/23 0911 (!) 132/90     Pulse Rate 05/28/23 0911 82     Resp 05/28/23 0911 18     Temp 05/28/23 0911 99.1 F (37.3 C)     Temp src --      SpO2 05/28/23 0911 93 %     Weight --      Height --      Head Circumference --      Peak Flow --      Pain Score 05/28/23 0908 7     Pain Loc --      Pain Edu? --      Excl. in GC? --    No data found.  Updated Vital Signs BP (!) 132/90   Pulse 82   Temp 99.1 F (37.3 C)   Resp 18   LMP 05/14/2023   SpO2 93%    Physical Exam Vitals and nursing note reviewed.  Constitutional:      Appearance: She is not ill-appearing or diaphoretic.  HENT:     Right Ear: Tympanic membrane and ear canal normal.     Left Ear: Tympanic membrane and ear canal normal.      Ears:     Comments: Clear fluid behind bilat TMs    Nose: Congestion present. No rhinorrhea.     Mouth/Throat:     Mouth: Mucous membranes are moist.     Pharynx: Oropharynx is clear. No posterior oropharyngeal erythema.  Eyes:     Conjunctiva/sclera: Conjunctivae normal.  Cardiovascular:     Rate and Rhythm: Normal rate and regular rhythm.     Pulses: Normal pulses.     Heart sounds: Normal heart sounds.  Pulmonary:     Effort: Pulmonary effort is normal.     Breath sounds: Normal breath sounds.  Abdominal:     Palpations: Abdomen is soft.     Tenderness: There is no abdominal tenderness.  Musculoskeletal:     Cervical back: Normal range of motion.  Lymphadenopathy:     Cervical: No cervical adenopathy.  Skin:    General: Skin is warm and dry.  Neurological:     Mental Status: She is alert and oriented to person, place, and time.     UC Treatments / Results  Labs (all labs ordered are listed, but only abnormal results are displayed) Labs Reviewed - No data to display  EKG   Radiology No results found.  Procedures Procedures (including critical care time)  Medications Ordered in UC Medications - No data to display  Initial Impression / Assessment and Plan / UC Course  I have reviewed the triage vital signs and the nursing notes.  Pertinent labs & imaging results that were available during my care of the patient were reviewed by me and considered in my medical decision making (see chart for details).  Positive influenza A last night Afebrile in clinic Discussed viral etiology, symptomatic care. Recommend mucinex BID for congestion, add nasal spray daily. Tylenol and ibuprofen for aches. Work note provided. Return precautions discussed. Patient agrees to plan  Final Clinical Impressions(s) / UC Diagnoses   Final diagnoses:  Influenza A     Discharge Instructions      I recommend mucinex twice daily with LOTS  OF FLUIDS! There is a goodRx coupon attached  for the mucinex  I recommend once daily nasal spray to reduce congestion and pressure behind the ears  If you need to use the nausea medicine, it can be taken every 6 hours.  Use ibuprofen or tylenol for headache, body aches, and fever     ED Prescriptions     Medication Sig Dispense Auth. Provider   guaiFENesin (MUCINEX) 600 MG 12 hr tablet Take 1 tablet (600 mg total) by mouth 2 (two) times daily for 5 days. 10 tablet Keiera Strathman, PA-C   fluticasone (FLONASE) 50 MCG/ACT nasal spray Place 2 sprays into both nostrils daily. 9.9 mL Lenoria Narine, PA-C   ondansetron (ZOFRAN-ODT) 4 MG disintegrating tablet Take 1 tablet (4 mg total) by mouth every 6 (six) hours as needed for nausea or vomiting. 20 tablet Darron Stuck, Lurena Joiner, PA-C      PDMP not reviewed this encounter.   Marlow Baars, [place] 05/28/23 [phone]

## 2023-05-28 NOTE — ED Notes (Signed)
Patient states she didn't want to wait in ER for hours during triage. Patient ambulatory out of ED.

## 2023-05-28 NOTE — ED Notes (Signed)
Called no answer, registration states they left
